# Patient Record
Sex: Female | Born: 1975 | Race: Black or African American | Hispanic: No | Marital: Single | Smoking: Current every day smoker
Health system: Southern US, Community
[De-identification: ages and names within clinical notes are randomized; demographics above are authoritative.]

## PROBLEM LIST (undated history)

## (undated) DIAGNOSIS — F419 Anxiety disorder, unspecified: Secondary | ICD-10-CM

---

## 1998-07-30 ENCOUNTER — Emergency Department (HOSPITAL_COMMUNITY): Admission: EM | Admit: 1998-07-30 | Discharge: 1998-07-31 | Payer: Self-pay | Admitting: Emergency Medicine

## 1998-12-31 ENCOUNTER — Emergency Department (HOSPITAL_COMMUNITY): Admission: EM | Admit: 1998-12-31 | Discharge: 1999-01-01 | Payer: Self-pay | Admitting: Internal Medicine

## 2000-04-08 ENCOUNTER — Encounter: Payer: Self-pay | Admitting: Obstetrics

## 2000-04-08 ENCOUNTER — Inpatient Hospital Stay (HOSPITAL_COMMUNITY): Admission: AD | Admit: 2000-04-08 | Discharge: 2000-04-08 | Payer: Self-pay | Admitting: Obstetrics

## 2000-04-10 ENCOUNTER — Inpatient Hospital Stay (HOSPITAL_COMMUNITY): Admission: AD | Admit: 2000-04-10 | Discharge: 2000-04-10 | Payer: Self-pay | Admitting: *Deleted

## 2003-07-27 ENCOUNTER — Emergency Department (HOSPITAL_COMMUNITY): Admission: EM | Admit: 2003-07-27 | Discharge: 2003-07-27 | Payer: Self-pay | Admitting: Emergency Medicine

## 2003-07-27 ENCOUNTER — Encounter: Payer: Self-pay | Admitting: Emergency Medicine

## 2005-07-29 ENCOUNTER — Other Ambulatory Visit: Admission: RE | Admit: 2005-07-29 | Discharge: 2005-07-29 | Payer: Self-pay | Admitting: Family Medicine

## 2007-07-12 ENCOUNTER — Inpatient Hospital Stay (HOSPITAL_COMMUNITY): Admission: AD | Admit: 2007-07-12 | Discharge: 2007-07-13 | Payer: Self-pay | Admitting: Obstetrics & Gynecology

## 2007-07-19 ENCOUNTER — Inpatient Hospital Stay (HOSPITAL_COMMUNITY): Admission: RE | Admit: 2007-07-19 | Discharge: 2007-07-19 | Payer: Self-pay

## 2007-10-06 ENCOUNTER — Ambulatory Visit (HOSPITAL_COMMUNITY): Admission: RE | Admit: 2007-10-06 | Discharge: 2007-10-06 | Payer: Self-pay | Admitting: Obstetrics & Gynecology

## 2007-10-14 ENCOUNTER — Ambulatory Visit: Payer: Self-pay | Admitting: Family Medicine

## 2007-11-11 ENCOUNTER — Ambulatory Visit: Payer: Self-pay | Admitting: Obstetrics & Gynecology

## 2007-11-12 ENCOUNTER — Ambulatory Visit: Payer: Self-pay | Admitting: Cardiology

## 2007-11-22 ENCOUNTER — Ambulatory Visit: Payer: Self-pay

## 2007-12-02 ENCOUNTER — Ambulatory Visit: Payer: Self-pay | Admitting: Obstetrics & Gynecology

## 2007-12-13 ENCOUNTER — Ambulatory Visit: Payer: Self-pay | Admitting: Obstetrics & Gynecology

## 2007-12-13 ENCOUNTER — Ambulatory Visit (HOSPITAL_COMMUNITY): Admission: RE | Admit: 2007-12-13 | Discharge: 2007-12-13 | Payer: Self-pay | Admitting: Obstetrics and Gynecology

## 2007-12-13 ENCOUNTER — Encounter: Payer: Self-pay | Admitting: Family Medicine

## 2007-12-13 LAB — CONVERTED CEMR LAB
Hemoglobin: 12 g/dL (ref 12.0–15.0)
MCHC: 32.2 g/dL (ref 30.0–36.0)
MCV: 86.9 fL (ref 78.0–100.0)
RBC: 4.29 M/uL (ref 3.87–5.11)

## 2007-12-27 ENCOUNTER — Ambulatory Visit: Payer: Self-pay | Admitting: Obstetrics & Gynecology

## 2008-01-10 ENCOUNTER — Ambulatory Visit: Payer: Self-pay | Admitting: *Deleted

## 2008-01-24 ENCOUNTER — Ambulatory Visit: Payer: Self-pay | Admitting: Obstetrics & Gynecology

## 2008-01-31 ENCOUNTER — Ambulatory Visit: Payer: Self-pay | Admitting: Obstetrics & Gynecology

## 2008-02-17 ENCOUNTER — Ambulatory Visit: Payer: Self-pay | Admitting: Obstetrics & Gynecology

## 2008-02-24 ENCOUNTER — Ambulatory Visit: Payer: Self-pay | Admitting: Obstetrics & Gynecology

## 2008-03-02 ENCOUNTER — Ambulatory Visit: Payer: Self-pay | Admitting: Advanced Practice Midwife

## 2008-03-02 ENCOUNTER — Inpatient Hospital Stay (HOSPITAL_COMMUNITY): Admission: AD | Admit: 2008-03-02 | Discharge: 2008-03-02 | Payer: Self-pay | Admitting: Obstetrics & Gynecology

## 2008-03-02 ENCOUNTER — Ambulatory Visit: Payer: Self-pay | Admitting: Obstetrics & Gynecology

## 2008-03-06 ENCOUNTER — Ambulatory Visit: Payer: Self-pay | Admitting: Obstetrics and Gynecology

## 2008-03-06 ENCOUNTER — Inpatient Hospital Stay (HOSPITAL_COMMUNITY): Admission: AD | Admit: 2008-03-06 | Discharge: 2008-03-08 | Payer: Self-pay | Admitting: Obstetrics & Gynecology

## 2008-03-06 ENCOUNTER — Inpatient Hospital Stay (HOSPITAL_COMMUNITY): Admission: AD | Admit: 2008-03-06 | Discharge: 2008-03-06 | Payer: Self-pay | Admitting: Gynecology

## 2008-03-09 ENCOUNTER — Ambulatory Visit: Payer: Self-pay | Admitting: Advanced Practice Midwife

## 2008-03-09 ENCOUNTER — Inpatient Hospital Stay (HOSPITAL_COMMUNITY): Admission: AD | Admit: 2008-03-09 | Discharge: 2008-03-09 | Payer: Self-pay | Admitting: Gynecology

## 2010-09-29 ENCOUNTER — Emergency Department (HOSPITAL_COMMUNITY): Admission: EM | Admit: 2010-09-29 | Discharge: 2010-09-29 | Payer: Self-pay | Admitting: Emergency Medicine

## 2011-02-16 ENCOUNTER — Inpatient Hospital Stay (HOSPITAL_COMMUNITY)
Admission: AD | Admit: 2011-02-16 | Discharge: 2011-02-16 | Disposition: A | Discharge status: Home / Self Care | Payer: Self-pay | Source: Ambulatory Visit | Attending: Obstetrics and Gynecology | Admitting: Obstetrics and Gynecology

## 2011-02-16 DIAGNOSIS — F411 Generalized anxiety disorder: Secondary | ICD-10-CM | POA: Insufficient documentation

## 2011-02-21 ENCOUNTER — Other Ambulatory Visit: Payer: Self-pay | Admitting: Family Medicine

## 2011-02-21 ENCOUNTER — Other Ambulatory Visit (HOSPITAL_COMMUNITY)
Admission: RE | Admit: 2011-02-21 | Discharge: 2011-02-21 | Disposition: A | Discharge status: Home / Self Care | Payer: Self-pay | Source: Ambulatory Visit | Attending: Family Medicine | Admitting: Family Medicine

## 2011-02-21 DIAGNOSIS — Z0000 Encounter for general adult medical examination without abnormal findings: Secondary | ICD-10-CM | POA: Insufficient documentation

## 2011-02-24 ENCOUNTER — Emergency Department (HOSPITAL_COMMUNITY)
Admission: EM | Admit: 2011-02-24 | Discharge: 2011-02-24 | Disposition: A | Discharge status: Home / Self Care | Payer: Self-pay | Attending: Emergency Medicine | Admitting: Emergency Medicine

## 2011-02-24 DIAGNOSIS — F410 Panic disorder [episodic paroxysmal anxiety] without agoraphobia: Secondary | ICD-10-CM | POA: Insufficient documentation

## 2011-02-24 DIAGNOSIS — R0602 Shortness of breath: Secondary | ICD-10-CM | POA: Insufficient documentation

## 2011-02-24 DIAGNOSIS — F411 Generalized anxiety disorder: Secondary | ICD-10-CM | POA: Insufficient documentation

## 2011-02-28 ENCOUNTER — Emergency Department (HOSPITAL_COMMUNITY): Payer: Self-pay

## 2011-02-28 ENCOUNTER — Emergency Department (HOSPITAL_COMMUNITY)
Admission: EM | Admit: 2011-02-28 | Discharge: 2011-02-28 | Disposition: A | Discharge status: Home / Self Care | Payer: Self-pay | Attending: Emergency Medicine | Admitting: Emergency Medicine

## 2011-02-28 DIAGNOSIS — R079 Chest pain, unspecified: Secondary | ICD-10-CM | POA: Insufficient documentation

## 2011-02-28 DIAGNOSIS — R0602 Shortness of breath: Secondary | ICD-10-CM | POA: Insufficient documentation

## 2011-02-28 LAB — POCT CARDIAC MARKERS
CKMB, poc: 7.6 ng/mL (ref 1.0–8.0)
Myoglobin, poc: 45.5 ng/mL (ref 12–200)
Troponin i, poc: 0.55 ng/mL (ref 0.00–0.09)

## 2011-02-28 LAB — CK TOTAL AND CKMB (NOT AT ARMC)
CK, MB: 0.6 ng/mL (ref 0.3–4.0)
Relative Index: INVALID (ref 0.0–2.5)
Total CK: 67 U/L (ref 7–177)

## 2011-02-28 LAB — PREGNANCY, URINE: Preg Test, Ur: NEGATIVE

## 2011-02-28 LAB — TROPONIN I
Troponin I: 0.01 ng/mL (ref 0.00–0.06)
Troponin I: 0.01 ng/mL (ref 0.00–0.06)

## 2011-02-28 LAB — URINALYSIS, ROUTINE W REFLEX MICROSCOPIC
Bilirubin Urine: NEGATIVE
Ketones, ur: NEGATIVE mg/dL
Nitrite: NEGATIVE
Protein, ur: NEGATIVE mg/dL
Specific Gravity, Urine: 1.007 (ref 1.005–1.030)
Urobilinogen, UA: 0.2 mg/dL (ref 0.0–1.0)

## 2011-02-28 LAB — POCT I-STAT, CHEM 8
BUN: 4 mg/dL — ABNORMAL LOW (ref 6–23)
Calcium, Ion: 1.08 mmol/L — ABNORMAL LOW (ref 1.12–1.32)
Chloride: 107 mEq/L (ref 96–112)
Potassium: 3.1 mEq/L — ABNORMAL LOW (ref 3.5–5.1)

## 2011-02-28 MED ORDER — IOHEXOL 300 MG/ML  SOLN
80.0000 mL | Freq: Once | INTRAMUSCULAR | Status: AC | PRN
  Administered 2011-02-28: 80 mL via INTRAVENOUS

## 2011-04-15 ENCOUNTER — Emergency Department (HOSPITAL_COMMUNITY)
Admission: EM | Admit: 2011-04-15 | Discharge: 2011-04-15 | Disposition: A | Discharge status: Home / Self Care | Payer: Self-pay | Attending: Emergency Medicine | Admitting: Emergency Medicine

## 2011-04-15 ENCOUNTER — Emergency Department (HOSPITAL_COMMUNITY): Payer: Self-pay

## 2011-04-15 DIAGNOSIS — F410 Panic disorder [episodic paroxysmal anxiety] without agoraphobia: Secondary | ICD-10-CM | POA: Insufficient documentation

## 2011-04-15 DIAGNOSIS — R0602 Shortness of breath: Secondary | ICD-10-CM | POA: Insufficient documentation

## 2011-04-15 LAB — BASIC METABOLIC PANEL
BUN: 11 mg/dL (ref 6–23)
CO2: 21 mEq/L (ref 19–32)
Chloride: 110 mEq/L (ref 96–112)
Creatinine, Ser: 0.57 mg/dL (ref 0.4–1.2)
GFR calc Af Amer: >60 mL/min (ref >60)
Potassium: 3.7 mEq/L (ref 3.5–5.1)

## 2011-04-15 LAB — CBC
Hemoglobin: 12.7 g/dL (ref 12.0–15.0)
MCH: 28.7 pg (ref 26.0–34.0)
MCV: 84 fL (ref 78.0–100.0)
Platelets: 247 10*3/uL (ref 150–400)
RBC: 4.43 MIL/uL (ref 3.87–5.11)
WBC: 8.6 10*3/uL (ref 4.0–10.5)

## 2011-04-15 LAB — DIFFERENTIAL
Lymphs Abs: 3.7 10*3/uL (ref 0.7–4.0)
Monocytes Relative: 6 % (ref 3–12)
Neutro Abs: 4.1 10*3/uL (ref 1.7–7.7)
Neutrophils Relative %: 47 % (ref 43–77)

## 2011-04-22 ENCOUNTER — Emergency Department (HOSPITAL_COMMUNITY)
Admission: EM | Admit: 2011-04-22 | Discharge: 2011-04-22 | Disposition: A | Discharge status: Home / Self Care | Payer: Self-pay | Attending: Emergency Medicine | Admitting: Emergency Medicine

## 2011-04-22 ENCOUNTER — Inpatient Hospital Stay (HOSPITAL_COMMUNITY)
Admission: AD | Admit: 2011-04-22 | Discharge: 2011-04-22 | Disposition: A | Discharge status: Home / Self Care | Payer: Self-pay | Source: Ambulatory Visit | Attending: Obstetrics & Gynecology | Admitting: Obstetrics & Gynecology

## 2011-04-22 ENCOUNTER — Inpatient Hospital Stay (HOSPITAL_COMMUNITY): Payer: Self-pay

## 2011-04-22 DIAGNOSIS — N390 Urinary tract infection, site not specified: Secondary | ICD-10-CM

## 2011-04-22 DIAGNOSIS — N949 Unspecified condition associated with female genital organs and menstrual cycle: Secondary | ICD-10-CM | POA: Insufficient documentation

## 2011-04-22 LAB — URINALYSIS, ROUTINE W REFLEX MICROSCOPIC
Glucose, UA: NEGATIVE mg/dL
Ketones, ur: 15 mg/dL — AB
Nitrite: NEGATIVE
Specific Gravity, Urine: >1.03 — ABNORMAL HIGH (ref 1.005–1.030)
pH: 6.5 (ref 5.0–8.0)

## 2011-04-22 LAB — WET PREP, GENITAL: Trich, Wet Prep: NONE SEEN

## 2011-04-22 LAB — GC/CHLAMYDIA PROBE AMP, GENITAL
Chlamydia, DNA Probe: NEGATIVE
GC Probe Amp, Genital: NEGATIVE

## 2011-04-22 LAB — POCT PREGNANCY, URINE: Preg Test, Ur: NEGATIVE

## 2011-04-22 LAB — URINE MICROSCOPIC-ADD ON

## 2011-04-23 LAB — URINE CULTURE
Colony Count: >=100000
Culture  Setup Time: 201205011025

## 2011-05-06 NOTE — Assessment & Plan Note (Signed)
[facility]                            CARDIOLOGY OFFICE NOTE   Leah [name], Leah [name]                      MRN:          [id number]  DATE:11/12/2007                            DOB:          [birth year]    I was asked by Dr. Elsie Lincoln of [facility] to evaluate  Leah. [name] about dizziness.   She is 35 years of age, African-American female, single, with 2 previous  children with vaginal deliveries.  She had no problems with those  pregnancies that she is aware of, or the deliveries.  She has noted  several episodes of dizziness.  Once was while she was just standing up  talking to friends.  She felt lightheaded but not true vertigo.  She did  not faint.  She felt no tachy palpitations or chest pain.  She has no  nausea or vomiting.  The second episode happened while she was just  sitting down.  She had not changed positions.  She does not particularly  describe any orthostatic symptoms.   Her past medical history is negative for any cardiac or CNS disorders.   She is currently on prenatal vitamins.   She had no known drug allergies.   She has smoked.  She quit July 2008.   She drinks very little caffeinated beverages.  She does not use any  recreational products.  She does not drink any alcohol.   SURGERY:  None.   FAMILY HISTORY:  Really negative for premature heart disease.   SOCIAL HISTORY:  She is single.  She has 2 children.  She is an  Event organiser.   REVIEW OF SYSTEMS:  Show some history of allergies and hay fever and  some chronic anemia, otherwise negative.   PHYSICAL EXAMINATION:  Her blood pressure is 117/73, her pulse is 89 and  regular.  She is 5 feet 6 inches and weighs 178 pounds.  HEENT:  Normocephalic, atraumatic.  PERRLA.  Extraocular movements  intact.  Sclerae clear.  Facial symmetry is normal.  Carotid upstrokes are equal bilaterally without bruits, no JVD.  Thyroid  is not enlarged, trachea is  midline.  LUNGS:  Clear.  HEART:  Reveals a regular rate and rhythm.  PMI is poorly appreciated.  There is no gallop.  There is no rub.  ABDOMINAL EXAM:  Gravida.  EXTREMITIES:  Reveal no edema.  Pulses are intact.  NEURO EXAM:  Intact.  SKIN:  Unremarkable.   Electrocardiogram is normal except for nonspecific ST changes.   ASSESSMENT:  Episodic dizziness, probably not cardiogenic.   PLAN:  1. A 2D echocardiogram to rule out any structural heart disease.  2. Orthostatic precautions reviewed.  3. Stay well hydrated.     Thomas C. Daleen Squibb, MD, [facility]  Electronically Signed    TCW/MedQ  DD: 11/12/2007  DT: 11/12/2007  Job #: [id number]   cc:   [facility]

## 2011-05-26 ENCOUNTER — Encounter (INDEPENDENT_AMBULATORY_CARE_PROVIDER_SITE_OTHER): Payer: Self-pay | Admitting: Family Medicine

## 2011-05-26 DIAGNOSIS — N949 Unspecified condition associated with female genital organs and menstrual cycle: Secondary | ICD-10-CM

## 2011-05-27 NOTE — Group Therapy Note (Signed)
Leah [name], Leah [name] NO.:  [id number]  MEDICAL RECORD NO.:  [id number]           PATIENT TYPE:  A  LOCATION:  [facility]                   FACILITY:  [facility]  PHYSICIAN:  Lucina Mellow, DO   DATE OF BIRTH:  Jan 24, 1976  DATE OF SERVICE:                                 CLINIC NOTE  The patient presents for followup in the MAU, Apr 22, 2011, for complaint of abdominal and pelvic pain.  HISTORY OF PRESENT ILLNESS:  The patient is a 35 year old gravida 5, para 3-0-2-3 who presented to the MAU on Apr 22, 2011, early in the morning and was seen by midwife for abdominal and pelvic pain.  The patient stated that the pain felt like labor pain.  She denied any bleeding or abnormal vaginal discharge at that time.  The patient knew that she had previously been diagnosed with fibroids.  The patient uses an IUD for contraception.  States that since the placement of the IUD is approximately 2 years ago, it seems as if her pelvic pain has increased in frequency since then.  The pain is not constant.  It comes and goes, she cannot time it with cycling or with need to urinate or defecate. The patient does state, however, that she has irritable bowel-type symptoms and feels like, on occasion she does have to strain to have a bowel movement.  While at other time, she has diarrhea and a sudden urge to use the restroom.  The patient denies any urinary symptoms or feeling urinary-type symptoms when the pain occurs; however, she does give a history of frequent urinary tract infections.  The patient states that she tried to have intercourse a few days ago and it was somewhat painful for her that the pain only lasted a short period time.  She states that she has a very mild amount of pain today, nothing that she is too concerned about.  Denies any need for medications for pain today.  She denies any bleeding or vaginal discharge.  She states that she was to continue to have her IUD in  place.  PAST MEDICAL HISTORY:  Anxiety and depression.  SURGICAL HISTORY:  None.  SOCIAL HISTORY:  She denies use of alcohol, drugs, but does continue to smoke.  MEDICATIONS:  She currently is on include: 1. Celexa 20 mg daily. 2. Clonazepam 0.5 mg p.r.n. 3. Prenatal vitamins.  PHYSICAL EXAMINATION:  VITAL SIGNS:  Blood pressure 103/67, pulse of 81, temperature of 97.4, weight of 161.2 pounds, height is 64.75 inches tall.  GENERAL:  She is a pleasant African American female who looks her stated age at 56 years' old. HEART:  Regular rate and rhythm. LUNGS:  Clear to auscultation bilaterally. GENITALIA:  Externally, she has normal-appearing female genitalia. Internally, she has a small amount of very thin white discharge.  The cervix is easily identified.  There are Nabothian cyst noted to be at 7 and 2 o'clock without any bleeding.  The IUD strings are easily visualized.  On bimanual exam, the uterus is palpated to be about approximately 10-12 weeks in size and nontender.  The ovaries are each identified bilaterally and there  is no tenderness on ovarian and adnexa palpation.  The back of the bladder is palpated and tried to elicit a response.  The patient states well and was uncomfortable later as she felt like she need to urinate just to not reproduce the pain.  Posterior pressure is minimally applied it and this again was uncomfortable, but did not reproduce her pain.  ASSESSMENT:  Pelvic pain.  Unknown GYN origin at this time.  The patient's records are reviewed and shared with her that she does have adenomyosis and a uterine fibroid noted on her ultrasound from the MAU, while these may be the source of abnormal uterine bleeding and continuous pain.  The IUD in place would help control the bleeding and I recommend that she continue to have the IUD in place.  At this time, the patient wishes to continue with the IUD for her birth control.  The patient also on her ultrasound  did not show any ovarian cyst to explain her pain.  We discussed options of seeing a urologist to identify and treat her for possible interstitial cystitis and also to speak with her primary care physician for treatment for irritable bowel syndrome and constipation intermittently can also lead to explain pattern of lower abdominal/pelvic pain.  The patient voices understanding of all this. She states that the next time she has intercourse, if she were to experience pain, she should stop.  We discussed that position may be key and also for her to deeply identify and evaluate were the pain is coming from, is it because of her bladder, could she have interstitial cystitis or is it because she needs to evacuate her bowel.  The patient could not completely discuss all of these terms as her daughter was in the room and she was quite shy about talking about this in front of her daughter, that she voiced her understanding.  The patient today has no pain or identifiable reason or treatment needs identified, so the patient is discharged from the clinic with p.r.n. followup.  The patient voices understanding and agrees with this plan.          ______________________________ Lucina Mellow, DO    SH/MEDQ  D:  05/26/2011  T:  05/27/2011  Job:  [id number]

## 2011-06-26 ENCOUNTER — Emergency Department (HOSPITAL_COMMUNITY)
Admission: EM | Admit: 2011-06-26 | Discharge: 2011-06-26 | Disposition: A | Discharge status: Home / Self Care | Payer: Self-pay | Attending: Emergency Medicine | Admitting: Emergency Medicine

## 2011-06-26 DIAGNOSIS — J3489 Other specified disorders of nose and nasal sinuses: Secondary | ICD-10-CM | POA: Insufficient documentation

## 2011-06-26 DIAGNOSIS — R0602 Shortness of breath: Secondary | ICD-10-CM | POA: Insufficient documentation

## 2011-08-10 ENCOUNTER — Emergency Department (HOSPITAL_COMMUNITY): Payer: Self-pay

## 2011-08-10 ENCOUNTER — Emergency Department (HOSPITAL_COMMUNITY)
Admission: EM | Admit: 2011-08-10 | Discharge: 2011-08-10 | Disposition: A | Discharge status: Home / Self Care | Payer: Self-pay | Attending: Emergency Medicine | Admitting: Emergency Medicine

## 2011-08-10 DIAGNOSIS — R079 Chest pain, unspecified: Secondary | ICD-10-CM | POA: Insufficient documentation

## 2011-08-10 DIAGNOSIS — R42 Dizziness and giddiness: Secondary | ICD-10-CM | POA: Insufficient documentation

## 2011-08-10 DIAGNOSIS — R0789 Other chest pain: Secondary | ICD-10-CM | POA: Insufficient documentation

## 2011-08-10 DIAGNOSIS — R799 Abnormal finding of blood chemistry, unspecified: Secondary | ICD-10-CM | POA: Insufficient documentation

## 2011-08-10 DIAGNOSIS — R0602 Shortness of breath: Secondary | ICD-10-CM | POA: Insufficient documentation

## 2011-08-10 LAB — POCT I-STAT TROPONIN I: Troponin i, poc: 0.01 ng/mL (ref 0.00–0.08)

## 2011-08-10 LAB — URINALYSIS, ROUTINE W REFLEX MICROSCOPIC
Bilirubin Urine: NEGATIVE
Glucose, UA: NEGATIVE mg/dL
Hgb urine dipstick: NEGATIVE
Ketones, ur: NEGATIVE mg/dL
Protein, ur: NEGATIVE mg/dL
Urobilinogen, UA: 1 mg/dL (ref 0.0–1.0)

## 2011-08-10 LAB — POCT I-STAT, CHEM 8
BUN: 10 mg/dL (ref 6–23)
Calcium, Ion: 1.2 mmol/L (ref 1.12–1.32)
HCT: 40 % (ref 36.0–46.0)
Hemoglobin: 13.6 g/dL (ref 12.0–15.0)
Sodium: 140 mEq/L (ref 135–145)
TCO2: 23 mmol/L (ref 0–100)

## 2011-08-10 LAB — URINE MICROSCOPIC-ADD ON

## 2011-08-10 LAB — D-DIMER, QUANTITATIVE: D-Dimer, Quant: 0.54 ug/mL-FEU — ABNORMAL HIGH (ref 0.00–0.48)

## 2011-08-10 MED ORDER — IOHEXOL 300 MG/ML  SOLN
100.0000 mL | Freq: Once | INTRAMUSCULAR | Status: AC | PRN
  Administered 2011-08-10: 100 mL via INTRAVENOUS

## 2011-09-10 LAB — POCT URINALYSIS DIP (DEVICE)
Bilirubin Urine: NEGATIVE
Ketones, ur: NEGATIVE
Nitrite: NEGATIVE
Operator id: 194561
pH: 6

## 2011-09-11 LAB — POCT URINALYSIS DIP (DEVICE)
Bilirubin Urine: NEGATIVE
Ketones, ur: NEGATIVE
Nitrite: NEGATIVE
Operator id: 297281
Protein, ur: NEGATIVE
pH: 6.5

## 2011-09-12 LAB — POCT URINALYSIS DIP (DEVICE)
Bilirubin Urine: NEGATIVE
Nitrite: NEGATIVE
Nitrite: NEGATIVE
Nitrite: NEGATIVE
Operator id: 297281
Operator id: 297281
Operator id: 148111
Protein, ur: 30 — AB
Protein, ur: NEGATIVE
Protein, ur: NEGATIVE
Urobilinogen, UA: 0.2
pH: 6
pH: 6.5
pH: 6.5

## 2011-09-15 LAB — POCT URINALYSIS DIP (DEVICE)
Ketones, ur: NEGATIVE
Ketones, ur: NEGATIVE
Nitrite: NEGATIVE
Protein, ur: 30 — AB
Protein, ur: NEGATIVE
Urobilinogen, UA: 0.2
Urobilinogen, UA: 2 — ABNORMAL HIGH
pH: 6
pH: 6

## 2011-09-15 LAB — GLUCOSE, RANDOM: Glucose, Bld: 87

## 2011-09-15 LAB — CBC
HCT: 40.2
MCHC: 33.5
MCV: 85.2
Platelets: 179
Platelets: 196
RDW: 14.5
WBC: 14.1 — ABNORMAL HIGH

## 2011-09-26 LAB — POCT URINALYSIS DIP (DEVICE)
Bilirubin Urine: NEGATIVE
Nitrite: NEGATIVE
Protein, ur: NEGATIVE
pH: 7

## 2011-09-29 LAB — POCT URINALYSIS DIP (DEVICE)
Nitrite: NEGATIVE
Protein, ur: NEGATIVE
pH: 7

## 2011-09-30 LAB — POCT URINALYSIS DIP (DEVICE)
Nitrite: NEGATIVE
Protein, ur: NEGATIVE
pH: 7.5

## 2011-10-01 LAB — POCT URINALYSIS DIP (DEVICE)
Nitrite: NEGATIVE
Protein, ur: NEGATIVE
pH: 6.5

## 2011-10-06 LAB — URINALYSIS, ROUTINE W REFLEX MICROSCOPIC
Glucose, UA: NEGATIVE
Hgb urine dipstick: NEGATIVE
Protein, ur: NEGATIVE

## 2011-10-06 LAB — URINE MICROSCOPIC-ADD ON

## 2011-10-06 LAB — CBC
HCT: 38
MCV: 85.4
Platelets: 253
RBC: 4.45
WBC: 10.1

## 2011-10-06 LAB — WET PREP, GENITAL: Yeast Wet Prep HPF POC: NONE SEEN

## 2012-02-28 ENCOUNTER — Encounter (HOSPITAL_COMMUNITY): Payer: Self-pay | Admitting: *Deleted

## 2012-02-28 ENCOUNTER — Emergency Department (HOSPITAL_COMMUNITY): Payer: Self-pay

## 2012-02-28 ENCOUNTER — Emergency Department (HOSPITAL_COMMUNITY)
Admission: EM | Admit: 2012-02-28 | Discharge: 2012-02-28 | Disposition: A | Discharge status: Home / Self Care | Payer: Self-pay | Attending: Emergency Medicine | Admitting: Emergency Medicine

## 2012-02-28 ENCOUNTER — Other Ambulatory Visit: Payer: Self-pay

## 2012-02-28 DIAGNOSIS — J329 Chronic sinusitis, unspecified: Secondary | ICD-10-CM | POA: Insufficient documentation

## 2012-02-28 DIAGNOSIS — R072 Precordial pain: Secondary | ICD-10-CM | POA: Insufficient documentation

## 2012-02-28 DIAGNOSIS — J3489 Other specified disorders of nose and nasal sinuses: Secondary | ICD-10-CM | POA: Insufficient documentation

## 2012-02-28 DIAGNOSIS — F17200 Nicotine dependence, unspecified, uncomplicated: Secondary | ICD-10-CM | POA: Insufficient documentation

## 2012-02-28 DIAGNOSIS — R05 Cough: Secondary | ICD-10-CM | POA: Insufficient documentation

## 2012-02-28 DIAGNOSIS — R059 Cough, unspecified: Secondary | ICD-10-CM | POA: Insufficient documentation

## 2012-02-28 HISTORY — DX: Anxiety disorder, unspecified: F41.9

## 2012-02-28 MED ORDER — AZITHROMYCIN 250 MG PO TABS: 250.0000 mg | ORAL_TABLET | Freq: Every day | ORAL | Status: AC

## 2012-02-28 MED ORDER — NAPROXEN 500 MG PO TABS: 500.0000 mg | ORAL_TABLET | Freq: Two times a day (BID) | ORAL | Status: DC

## 2012-02-28 MED ORDER — KETOROLAC TROMETHAMINE 60 MG/2ML IM SOLN
60.0000 mg | Freq: Once | INTRAMUSCULAR | Status: AC
  Administered 2012-02-28: 60 mg via INTRAMUSCULAR
  Filled 2012-02-28: qty 2

## 2012-02-28 NOTE — ED Notes (Signed)
The pt is c/o lt upper chest5 pain for 30 minutes.  She woke up with this pain.  She is coughing constantly.  She has no previous history of cardiac.  Hx of anxiety

## 2012-02-28 NOTE — ED Notes (Signed)
Patient reports substernal chest pain rates it 5/10 denies n/v/d, with shortness of breath

## 2012-02-28 NOTE — Discharge Instructions (Signed)
Your caregiver has diagnosed you as having chest pain that is nonspecific for one problem. This means that after looking at you and examining you and ordering tests (such as blood work, chest x-rays and EKG), your caregiver does not believe that the problem is serious enough to need watching in the hospital. This judgment is often made after testing shows no acute heart attack and you are at low risk for sudden acute heart condition. Chest pain comes from many different causes.  Seek immediate medical attention if:   You have severe chest pain, especially if the pain is crushing or pressure-like and spreads to the arms, back, neck, or jaw, or if you have sweating, nausea, shortness of breath. This is an emergency. Don't wait to see if the pain will go away. Get medical help at once. Call 911 immediately. Do not drive herself to the hospital.   Your chest pain gets worse and does not go away with rest.   You have an attack of chest pain lasting longer than usual, despite rest and treatment with the medications your caregiver has prescribed   You awaken from sleep with chest pain or shortness of breath.   You feel faint or dizzy   You have chest pain not typical of your usual pain for which you originally saw your caregiver.  You must have a repeat evaluation within 24 hours for a recheck of your heart.  Please call your doctor this morning to schedule this appointment. If you do not have a family doctor, please see the list of doctors below.  RESOURCE GUIDE  Dental Problems  Patients with Medicaid: [facility]                     [facility] [address]                                           [address] Phone:  [phone]                                                  Phone:  [phone]  If unable to pay or uninsured, contact:  [facility] or [facility] to become qualified for the adult dental clinic.  Chronic Pain  Problems Contact [facility]  [phone] Patients need to be referred by their primary care doctor.  Insufficient Money for Medicine Contact [employer]:  call "211" or [facility] 271-5999.  No Primary Care Doctor Call [facility]  832-8000 Other agencies that provide inexpensive medical care    [facility]  [phone]    [facility]  [phone]    [facility]  [phone]    Women's Clinic  [phone]    [facility]  [phone]    [facility]  [phone]  Psychological Services  Health  [phone] [facility]  [phone] [facility]   800 853-5163 (emergency services 641-4993)  Substance Abuse Resources [facility]  336-882-2125 [facility] 336-784-9470 [facility] 336-285-9073 [facility] 336-845-3988 Residential & Outpatient Substance Abuse Program  800-659-3381  Abuse/Neglect [place] Child Abuse Hotline (336) 641-3795 [place] Child Abuse Hotline 800-378-5315 (After Hours)  Emergency Shelter [facility] (336) 271-5985  [facility] (336) 275-9566 [facility] (704) 372-4663  MRSA Hotline #:   832-7006    [place] [facility]  [facility] [place]     [employer]                          [facility] [address] [place]                       [address]      [address]  [place]                                                [place]                            [place] Phone:  [phone]                                   Phone:  [phone]                 Phone:  [phone]  [facility] Phone:  [phone]  [place] Child Abuse Hotline (336) 342-1394 (336) 342-3537 (After Hours)    

## 2012-02-28 NOTE — ED Provider Notes (Signed)
History     CSN: [id number]  Arrival date & time 02/28/12  0431   First MD Initiated Contact with Patient 02/28/12 [phone]      Chief Complaint  Patient presents with  . Chest Pain    (Consider location/radiation/quality/duration/timing/severity/associated sxs/prior treatment) HPI Comments: Pt is a pleasant 36 y/o female who has no sig PMH who presents with CP which is sharp, mid sternal and worse with breathing out through her mouth (not through her nose).  Sx are persistent, fluctuate with the respiratory cycle, not associated with swelling, travel, trauma, exogenous estrogen use, recent surgery or SOB.  She has a cough X 1 month and sinus pressure and drainage over the last month.  This has not seemed to get better.  She has had no meds pta.  She also deneis leg swelling, diarrhea, dysuria, fever, headache or other c/o.  She has no FHx of DVT / PE and has no hx of CAD in the family either.  Sx are moderate at this time.  Patient is a 36 y.o. female presenting with chest pain. The history is provided by the patient and a relative.  Chest Pain     Past Medical History  Diagnosis Date  . Anxiety     No past surgical history on file.  No family history on file.  History  Substance Use Topics  . Smoking status: Current Everyday Smoker  . Smokeless tobacco: Not on file  . Alcohol Use: No    OB History    Grav Para Term Preterm Abortions TAB SAB Ect Mult Living                  Review of Systems  Cardiovascular: Positive for chest pain.  All other systems reviewed and are negative.    Allergies  Review of patient's allergies indicates no known allergies.  Home Medications   Current Outpatient Rx  Name Route Sig Dispense Refill  . SERTRALINE HCL 50 MG PO TABS Oral Take 100 mg by mouth daily.    . AZITHROMYCIN 250 MG PO TABS Oral Take 1 tablet (250 mg total) by mouth daily. 500mg  PO day 1, then 250mg  PO days 205 6 tablet 0  . NAPROXEN 500 MG PO TABS Oral Take 1  tablet (500 mg total) by mouth 2 (two) times daily with a meal. 30 tablet 0    BP 116/68  Pulse 62  Temp(Src) 97.8 F (36.6 C) (Oral)  Resp 18  SpO2 100%  Physical Exam  Nursing note and vitals reviewed. Constitutional: She appears well-developed and well-nourished. No distress.  HENT:  Head: Normocephalic and atraumatic.  Mouth/Throat: Oropharynx is clear and moist. No oropharyngeal exudate.  Eyes: Conjunctivae and EOM are normal. Pupils are equal, round, and reactive to light. Right eye exhibits no discharge. Left eye exhibits no discharge. No scleral icterus.  Neck: Normal range of motion. Neck supple. No JVD present. No thyromegaly present.  Cardiovascular: Normal rate, regular rhythm, normal heart sounds and intact distal pulses.  Exam reveals no gallop and no friction rub.   No murmur heard. Pulmonary/Chest: Effort normal and breath sounds normal. No respiratory distress. She has no wheezes. She has no rales. She exhibits tenderness ( reproducible chest ttp in the parastenal areas bilaterally).  Abdominal: Soft. Bowel sounds are normal. She exhibits no distension and no mass. There is no tenderness.  Musculoskeletal: Normal range of motion. She exhibits no edema and no tenderness.  Lymphadenopathy:    She has no cervical adenopathy.  Neurological: She is alert. Coordination normal.  Skin: Skin is warm and dry. No rash noted. No erythema.  Psychiatric: She has a normal mood and affect. Her behavior is normal.    ED Course  Procedures (including critical care time)  ED ECG REPORT   Date: 02/28/2012   Rate: 68  Rhythm: normal sinus rhythm  QRS Axis: normal  Intervals: normal  ST/T Wave abnormalities: normal  Conduction Disutrbances:none  Narrative Interpretation:   Old EKG Reviewed: c/w 08/10/2011, no sig change   Labs Reviewed - No data to display Dg Chest 2 View  02/28/2012  *RADIOLOGY REPORT*  Clinical Data: Shortness of breath, cough.  CHEST - 2 VIEW  Comparison:  08/10/2011 CT  Findings: No significant change from priors.  No focal consolidation.  No pleural effusion or pneumothorax.  Heart size within normal limits as are the other mediastinal contours.  No acute osseous abnormality.  IMPRESSION: No acute process identified.  Original Report Authenticated By: Waneta Martins, M.D.     1. Chest pain   2. Sinusitis       MDM  Lung sounds / heart sounds normal, ECG normal, Pt is negative in very low risk category for Wells Score, Simplified Revised Geneva Score and the PERC.  With cough for a month and reproducible chest ttp, unlikely to be PE, r/o ptx / pna, tx for sinusitis.  IM toradol ordered.  Review of the MR shows that pt had CP last year in August and was found to have normal PE protocol CT at that time.  I have personally reviewed the imaging - neg for acute findings - agree with radiologist.  Toradol given, home on Z pak for isnusitis and naprosyn for CP.  Return precautions given.  Vida Roller, MD 02/28/12 [phone]

## 2012-03-25 ENCOUNTER — Encounter (HOSPITAL_COMMUNITY): Payer: Self-pay | Admitting: Emergency Medicine

## 2012-03-25 ENCOUNTER — Emergency Department (HOSPITAL_COMMUNITY)
Admission: EM | Admit: 2012-03-25 | Discharge: 2012-03-25 | Disposition: A | Discharge status: Home / Self Care | Payer: Self-pay | Attending: Emergency Medicine | Admitting: Emergency Medicine

## 2012-03-25 ENCOUNTER — Emergency Department (HOSPITAL_COMMUNITY): Payer: Self-pay

## 2012-03-25 ENCOUNTER — Other Ambulatory Visit: Payer: Self-pay

## 2012-03-25 DIAGNOSIS — R072 Precordial pain: Secondary | ICD-10-CM | POA: Insufficient documentation

## 2012-03-25 DIAGNOSIS — F17200 Nicotine dependence, unspecified, uncomplicated: Secondary | ICD-10-CM | POA: Insufficient documentation

## 2012-03-25 DIAGNOSIS — R0602 Shortness of breath: Secondary | ICD-10-CM | POA: Insufficient documentation

## 2012-03-25 LAB — CBC
HCT: 40.6 % (ref 36.0–46.0)
Hemoglobin: 13.6 g/dL (ref 12.0–15.0)
MCH: 28.8 pg (ref 26.0–34.0)
MCHC: 33.5 g/dL (ref 30.0–36.0)
MCV: 86 fL (ref 78.0–100.0)
Platelets: 249 10*3/uL (ref 150–400)
RBC: 4.72 MIL/uL (ref 3.87–5.11)
RDW: 13.3 % (ref 11.5–15.5)
WBC: 8.6 10*3/uL (ref 4.0–10.5)

## 2012-03-25 LAB — DIFFERENTIAL
Basophils Absolute: 0.1 10*3/uL (ref 0.0–0.1)
Basophils Relative: 1 % (ref 0–1)
Eosinophils Absolute: 0.4 10*3/uL (ref 0.0–0.7)
Eosinophils Relative: 5 % (ref 0–5)
Lymphocytes Relative: 46 % (ref 12–46)
Lymphs Abs: 4 10*3/uL (ref 0.7–4.0)
Monocytes Absolute: 0.4 10*3/uL (ref 0.1–1.0)
Monocytes Relative: 5 % (ref 3–12)
Neutro Abs: 3.8 10*3/uL (ref 1.7–7.7)
Neutrophils Relative %: 44 % (ref 43–77)

## 2012-03-25 LAB — BASIC METABOLIC PANEL
BUN: 9 mg/dL (ref 6–23)
CO2: 20 mEq/L (ref 19–32)
Calcium: 8.8 mg/dL (ref 8.4–10.5)
Chloride: 107 mEq/L (ref 96–112)
Creatinine, Ser: 0.61 mg/dL (ref 0.50–1.10)
GFR calc Af Amer: >90 mL/min (ref >90)
GFR calc non Af Amer: >90 mL/min (ref >90)
Glucose, Bld: 102 mg/dL — ABNORMAL HIGH (ref 70–99)
Potassium: 4.1 mEq/L (ref 3.5–5.1)
Sodium: 139 mEq/L (ref 135–145)

## 2012-03-25 NOTE — ED Notes (Signed)
PT. REPORTS GENERALIZED CHEST PAIN " UNCOMFORTABLE" WITH SOB ONSET THIS EVENING , DENIES NAUSEA OR DIAPHORESIS.

## 2012-03-25 NOTE — ED Provider Notes (Signed)
History    24 six-year-old female with chest pain. Gradual onset about a day ago. The substernal area without radiation. Patient cannot remember to stay at onset. Was associated with mild shortness of breath. Pain actually resolved prior to my evaluation. No palpitations. No nausea, vomiting. No diaphoresis. No unusual leg pain or swelling. Denies personal or family history of blood clot.  CSN: [id number]  Arrival date & time 03/25/12  4098   First MD Initiated Contact with Patient 03/25/12 0300      Chief Complaint  Patient presents with  . Chest Pain    (Consider location/radiation/quality/duration/timing/severity/associated sxs/prior treatment) HPI  Past Medical History  Diagnosis Date  . Anxiety     History reviewed. No pertinent past surgical history.  No family history on file.  History  Substance Use Topics  . Smoking status: Current Everyday Smoker  . Smokeless tobacco: Not on file  . Alcohol Use: No    OB History    Grav Para Term Preterm Abortions TAB SAB Ect Mult Living                  Review of Systems   Review of symptoms negative unless otherwise noted in HPI.   Allergies  Review of patient's allergies indicates no known allergies.  Home Medications  No current outpatient prescriptions on file.  BP 96/58  Pulse 78  Temp(Src) 98.4 F (36.9 C) (Oral)  Resp 16  SpO2 100%  LMP 01/26/2012  Physical Exam  Nursing note and vitals reviewed. Constitutional: She appears well-developed and well-nourished. No distress.  HENT:  Head: Normocephalic and atraumatic.  Eyes: Conjunctivae are normal. Right eye exhibits no discharge. Left eye exhibits no discharge.  Neck: Neck supple.  Cardiovascular: Normal rate, regular rhythm and normal heart sounds.  Exam reveals no gallop and no friction rub.   No murmur heard. Pulmonary/Chest: Effort normal and breath sounds normal. No respiratory distress.       Upper anterior chest wall to inspection. Chest pain  is not reproducible.  Abdominal: Soft. She exhibits no distension. There is no tenderness.  Musculoskeletal: She exhibits no edema and no tenderness.  Neurological: She is alert.  Skin: Skin is warm and dry.  Psychiatric: She has a normal mood and affect. Her behavior is normal. Thought content normal.    ED Course  Procedures (including critical care time)  Labs Reviewed  BASIC METABOLIC PANEL - Abnormal; Notable for the following:    Glucose, Bld 102 (*)    All other components within normal limits  CBC  DIFFERENTIAL  POCT I-STAT TROPONIN I  LAB REPORT - SCANNED   Dg Chest 2 View  03/25/2012  *RADIOLOGY REPORT*  Clinical Data: Chest pain.  CHEST - 2 VIEW  Comparison: 03/10/2007.  Findings: The cardiac silhouette, mediastinal and hilar contours are within normal limits and stable.  The lungs are clear. Mild bronchitic type changes, likely related to smoking.  No pleural effusion.  The bony thorax is intact.  IMPRESSION: No acute pulmonary findings.  Original Report Authenticated By: P. Loralie Champagne, M.D.    EKG:  Rhythm: Normal sinus rhythm Rate: 74 Axis: Normal axis Intervals: Normal ST segments: Nonspecific ST changes. There is T-wave flattening anteriorly.  1. Chest pain       MDM  [age]-year-old female with chest pain. Results of my evaluation. Atypical in nature for ACS given constant duration for almost a day. EKG is non-provocative. Troponin is normal. Doubt infectious. Doubt PE. Other etiology is unclear at  this time, regular suspicion for emergent etiology. Return precautions were discussed. Outpatient followup.        Raeford Razor, MD 03/30/12 [phone]

## 2012-03-25 NOTE — ED Notes (Signed)
Pt reports having mid sternal chest pain since yesterday evening, constant and non radiating pain with mild shortness of breath. Pt denies any chest pain, shortness of breath or n/v/d at this time. Pt denies taking any medications, "it went away on its on".

## 2012-03-25 NOTE — Discharge Instructions (Signed)
Chest Pain (Nonspecific) It is often hard to give a specific diagnosis for the cause of chest pain. There is always a chance that your pain could be related to something serious, such as a heart attack or a blood clot in the lungs. You need to follow up with your caregiver for further evaluation. CAUSES   Heartburn.   Pneumonia or bronchitis.   Anxiety or stress.   Inflammation around your heart (pericarditis) or lung (pleuritis or pleurisy).   A blood clot in the lung.   A collapsed lung (pneumothorax). It can develop suddenly on its own (spontaneous pneumothorax) or from injury (trauma) to the chest.   Shingles infection (herpes zoster virus).  The chest wall is composed of bones, muscles, and cartilage. Any of these can be the source of the pain.  The bones can be bruised by injury.   The muscles or cartilage can be strained by coughing or overwork.   The cartilage can be affected by inflammation and become sore (costochondritis).  DIAGNOSIS  Lab tests or other studies, such as X-rays, electrocardiography, stress testing, or cardiac imaging, may be needed to find the cause of your pain.  TREATMENT   Treatment depends on what may be causing your chest pain. Treatment may include:   Acid blockers for heartburn.   Anti-inflammatory medicine.   Pain medicine for inflammatory conditions.   Antibiotics if an infection is present.   You may be advised to change lifestyle habits. This includes stopping smoking and avoiding alcohol, caffeine, and chocolate.   You may be advised to keep your head raised (elevated) when sleeping. This reduces the chance of acid going backward from your stomach into your esophagus.   Most of the time, nonspecific chest pain will improve within 2 to 3 days with rest and mild pain medicine.  HOME CARE INSTRUCTIONS   If antibiotics were prescribed, take your antibiotics as directed. Finish them even if you start to feel better.   For the next few  days, avoid physical activities that bring on chest pain. Continue physical activities as directed.   Do not smoke.   Avoid drinking alcohol.   Only take over-the-counter or prescription medicine for pain, discomfort, or fever as directed by your caregiver.   Follow your caregiver's suggestions for further testing if your chest pain does not go away.   Keep any follow-up appointments you made. If you do not go to an appointment, you could develop lasting (chronic) problems with pain. If there is any problem keeping an appointment, you must call to reschedule.  SEEK MEDICAL CARE IF:   You think you are having problems from the medicine you are taking. Read your medicine instructions carefully.   Your chest pain does not go away, even after treatment.   You develop a rash with blisters on your chest.  SEEK IMMEDIATE MEDICAL CARE IF:   You have increased chest pain or pain that spreads to your arm, neck, jaw, back, or abdomen.   You develop shortness of breath, an increasing cough, or you are coughing up blood.   You have severe back or abdominal pain, feel nauseous, or vomit.   You develop severe weakness, fainting, or chills.   You have a fever.  THIS IS AN EMERGENCY. Do not wait to see if the pain will go away. Get medical help at once. Call your local emergency services (911 in [place] Do not drive yourself to the hospital. MAKE SURE YOU:   Understand these instructions.     Will watch your condition.   Will get help right away if you are not doing well or get worse.  Document Released: 09/17/2005 Document Revised: 11/27/2011 Document Reviewed: 07/13/2008 [employer] Patient Information 2012 [employer]. 

## 2012-03-25 NOTE — ED Notes (Signed)
Pt denies any questions or pain upon discharge. 

## 2012-04-11 ENCOUNTER — Emergency Department (HOSPITAL_COMMUNITY)
Admission: EM | Admit: 2012-04-11 | Discharge: 2012-04-11 | Disposition: A | Discharge status: Home / Self Care | Payer: Self-pay | Attending: Emergency Medicine | Admitting: Emergency Medicine

## 2012-04-11 ENCOUNTER — Emergency Department (HOSPITAL_COMMUNITY): Payer: Self-pay

## 2012-04-11 ENCOUNTER — Encounter (HOSPITAL_COMMUNITY): Payer: Self-pay | Admitting: Emergency Medicine

## 2012-04-11 DIAGNOSIS — F411 Generalized anxiety disorder: Secondary | ICD-10-CM | POA: Insufficient documentation

## 2012-04-11 DIAGNOSIS — F419 Anxiety disorder, unspecified: Secondary | ICD-10-CM

## 2012-04-11 DIAGNOSIS — R0789 Other chest pain: Secondary | ICD-10-CM | POA: Insufficient documentation

## 2012-04-11 DIAGNOSIS — R0602 Shortness of breath: Secondary | ICD-10-CM | POA: Insufficient documentation

## 2012-04-11 DIAGNOSIS — R5381 Other malaise: Secondary | ICD-10-CM | POA: Insufficient documentation

## 2012-04-11 DIAGNOSIS — R5383 Other fatigue: Secondary | ICD-10-CM | POA: Insufficient documentation

## 2012-04-11 LAB — CBC
HCT: 42.5 % (ref 36.0–46.0)
Hemoglobin: 14.4 g/dL (ref 12.0–15.0)
MCH: 28.8 pg (ref 26.0–34.0)
MCV: 85 fL (ref 78.0–100.0)
RBC: 5 MIL/uL (ref 3.87–5.11)
WBC: 9.6 10*3/uL (ref 4.0–10.5)

## 2012-04-11 LAB — BASIC METABOLIC PANEL
BUN: 10 mg/dL (ref 6–23)
CO2: 27 mEq/L (ref 19–32)
Calcium: 9.5 mg/dL (ref 8.4–10.5)
Creatinine, Ser: 0.62 mg/dL (ref 0.50–1.10)
Glucose, Bld: 69 mg/dL — ABNORMAL LOW (ref 70–99)
Sodium: 141 mEq/L (ref 135–145)

## 2012-04-11 LAB — DIFFERENTIAL
Eosinophils Relative: 5 % (ref 0–5)
Lymphocytes Relative: 31 % (ref 12–46)
Lymphs Abs: 3 10*3/uL (ref 0.7–4.0)
Monocytes Absolute: 0.8 10*3/uL (ref 0.1–1.0)
Monocytes Relative: 8 % (ref 3–12)

## 2012-04-11 LAB — POCT I-STAT TROPONIN I

## 2012-04-11 NOTE — ED Notes (Signed)
Pt presented to the ER with c/o of ShoB and chest tightness, state sx started eariler today, pt also repots having discomfort in left rib cage area.

## 2012-04-11 NOTE — ED Provider Notes (Signed)
History     CSN: [id number]  Arrival date & time 04/11/12  2005   First MD Initiated Contact with Patient 04/11/12 2242      Chief Complaint  Patient presents with  . chest tightness   . Shortness of Breath    (Consider location/radiation/quality/duration/timing/severity/associated sxs/prior treatment) Patient is a 36 y.o. female presenting with shortness of breath. The history is provided by the patient.  Shortness of Breath  Associated symptoms include shortness of breath.   Patient here after becoming anxious while watching a mo and developing shortness of breath and chest tightness. History of anxiety and this is similar. Patient has been out of her anxiety medications which include Zoloft. Tightness was midsternal without diaphoresis or nausea. No recent fever or URI symptoms. No leg pain or swelling. Has been seen in the ED multiple times for same with negative workups. Patient now feels back to her baseline. Denies any suicidal or homicidal ideations.  Past Medical History  Diagnosis Date  . Anxiety     History reviewed. No pertinent past surgical history.  Family History  Problem Relation Age of Onset  . Cancer Mother   . Cancer Other     History  Substance Use Topics  . Smoking status: Current Everyday Smoker    Types: Cigarettes  . Smokeless tobacco: Not on file  . Alcohol Use: Yes     social    OB History    Grav Para Term Preterm Abortions TAB SAB Ect Mult Living                  Review of Systems  Respiratory: Positive for shortness of breath.   All other systems reviewed and are negative.    Allergies  Review of patient's allergies indicates no known allergies.  Home Medications  No current outpatient prescriptions on file.  BP 109/62  Pulse 72  Temp(Src) 98.3 F (36.8 C) (Oral)  Resp 20  SpO2 99%  LMP 02/23/2012  Physical Exam  Nursing note and vitals reviewed. Constitutional: She is oriented to person, place, and time. She appears  well-developed and well-nourished.  Non-toxic appearance. No distress.  HENT:  Head: Normocephalic and atraumatic.  Eyes: Conjunctivae, EOM and lids are normal. Pupils are equal, round, and reactive to light.  Neck: Normal range of motion. Neck supple. No tracheal deviation present. No mass present.  Cardiovascular: Normal rate, regular rhythm and normal heart sounds.  Exam reveals no gallop.   No murmur heard. Pulmonary/Chest: Effort normal and breath sounds normal. No stridor. No respiratory distress. She has no decreased breath sounds. She has no wheezes. She has no rhonchi. She has no rales.  Abdominal: Soft. Normal appearance and bowel sounds are normal. She exhibits no distension. There is no tenderness. There is no rebound and no CVA tenderness.  Musculoskeletal: Normal range of motion. She exhibits no edema and no tenderness.  Neurological: She is alert and oriented to person, place, and time. She has normal strength. No cranial nerve deficit or sensory deficit. GCS eye subscore is 4. GCS verbal subscore is 5. GCS motor subscore is 6.  Skin: Skin is warm and dry. No abrasion and no rash noted.  Psychiatric: She has a normal mood and affect. Her speech is normal and behavior is normal. She expresses no homicidal and no suicidal ideation.    ED Course  Procedures (including critical care time)  Labs Reviewed  BASIC METABOLIC PANEL - Abnormal; Notable for the following:    Glucose, Bld  69 (*)    All other components within normal limits  CBC  DIFFERENTIAL  POCT I-STAT TROPONIN I   Dg Chest 2 View  04/11/2012  *RADIOLOGY REPORT*  Clinical Data: Shortness of breath and weakness  CHEST - 2 VIEW  Comparison: 03/25/2012  Findings: Normal heart size and pulmonary vascularity.  No focal airspace consolidation in the lungs.  No blunting of costophrenic angles.  No pneumothorax.  No significant changes since the previous study.  IMPRESSION: No evidence of active pulmonary disease.  Original  Report Authenticated By: Marlon Pel, M.D.     No diagnosis found.    MDM   Date: 04/11/2012  Rate: 70  Rhythm: normal sinus rhythm  QRS Axis: normal  Intervals: normal  ST/T Wave abnormalities: normal  Conduction Disutrbances:none  Narrative Interpretation:   Old EKG Reviewed: unchanged  Patient symptoms consistent with anxiety. No concern for ACS or pulmonary embolism. Patient will followup with her therapist          Toy Baker, MD 04/11/12 2251

## 2012-04-11 NOTE — Discharge Instructions (Signed)

## 2012-04-11 NOTE — ED Notes (Signed)
Family states pt had tightness in her chest with shortness of breath that started about 45 mins ago

## 2012-04-11 NOTE — ED Notes (Signed)
EKG performed and given to [facility] along with previous EKG

## 2012-09-04 ENCOUNTER — Encounter (HOSPITAL_COMMUNITY): Payer: Self-pay | Admitting: Emergency Medicine

## 2012-09-04 ENCOUNTER — Emergency Department (HOSPITAL_COMMUNITY)
Admission: EM | Admit: 2012-09-04 | Discharge: 2012-09-04 | Disposition: A | Discharge status: Home / Self Care | Payer: Self-pay | Attending: Emergency Medicine | Admitting: Emergency Medicine

## 2012-09-04 DIAGNOSIS — F17200 Nicotine dependence, unspecified, uncomplicated: Secondary | ICD-10-CM | POA: Insufficient documentation

## 2012-09-04 DIAGNOSIS — Z809 Family history of malignant neoplasm, unspecified: Secondary | ICD-10-CM | POA: Insufficient documentation

## 2012-09-04 DIAGNOSIS — T148XXA Other injury of unspecified body region, initial encounter: Secondary | ICD-10-CM | POA: Insufficient documentation

## 2012-09-04 DIAGNOSIS — X58XXXA Exposure to other specified factors, initial encounter: Secondary | ICD-10-CM | POA: Insufficient documentation

## 2012-09-04 DIAGNOSIS — F411 Generalized anxiety disorder: Secondary | ICD-10-CM | POA: Insufficient documentation

## 2012-09-04 MED ORDER — IBUPROFEN 400 MG PO TABS
800.0000 mg | ORAL_TABLET | Freq: Once | ORAL | Status: AC
  Administered 2012-09-04: 800 mg via ORAL
  Filled 2012-09-04: qty 2

## 2012-09-04 MED ORDER — IBUPROFEN 800 MG PO TABS: 800.0000 mg | ORAL_TABLET | Freq: Three times a day (TID) | ORAL | Status: AC | PRN

## 2012-09-04 NOTE — ED Notes (Addendum)
Pt reports R arm pain, and tingling since yesterday--reports some radiation to neck; denies injury; pt reports laid in bed all day and got worse

## 2012-09-04 NOTE — ED Provider Notes (Signed)
Medical screening examination/treatment/procedure(s) were performed by non-physician practitioner and as supervising physician I was immediately available for consultation/collaboration.   Lyanne Co, MD 09/04/12 2240

## 2012-09-04 NOTE — ED Provider Notes (Signed)
History     CSN: [id number]  Arrival date & time 09/04/12  0020   First MD Initiated Contact with Patient 09/04/12 0200      Chief Complaint  Patient presents with  . Arm Pain   HPI  History provided by the patient and friends. Patient is a 36 year old female with no significant PMH who presents with points of right upper extremity pain and tingling. She reports waking up Friday morning with pain and tingling sensation in right upper arm. Pain begins around the neck area and travels down the shoulder and arm. She denies any weakness or paralysis of the arm. She denies any swelling. There was no injury. 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 She denies having similar symptoms previously from work. Patient has not taken any medications for symptoms were used any treatments. She has no other complaints or concerns.   Past Medical History  Diagnosis Date  . Anxiety     History reviewed. No pertinent past surgical history.  Family History  Problem Relation Age of Onset  . Cancer Mother   . Cancer Other     History  Substance Use Topics  . Smoking status: Current Every Day Smoker -- 1.0 packs/day    Types: Cigarettes  . Smokeless tobacco: Not on file  . Alcohol Use: Yes     social    OB History    Grav Para Term Preterm Abortions TAB SAB Ect Mult Living                  Review of Systems  Constitutional: Negative for fever and chills.  HENT: Positive for neck pain. Negative for neck stiffness.   Musculoskeletal: Negative for back pain.  Neurological: Negative for weakness.    Allergies  Review of patient's allergies indicates no known allergies.  Home Medications   Current Outpatient Rx  Name Route Sig Dispense Refill  . SERTRALINE HCL 100 MG PO TABS Oral Take 100 mg by mouth daily.      BP 103/69  Pulse 72  Temp 98.5 F (36.9 C) (Oral)  Resp 18  SpO2 97%  Physical Exam  Nursing note and vitals  reviewed. Constitutional: She is oriented to person, place, and time. She appears well-developed and well-nourished. No distress.  HENT:  Head: Normocephalic.  Neck: Normal range of motion. Neck supple.  Cardiovascular: Normal rate and regular rhythm.   No murmur heard. Pulmonary/Chest: Effort normal and breath sounds normal. No respiratory distress. She has no wheezes.  Musculoskeletal: Normal range of motion. She exhibits no edema and no tenderness.       Cervical back: She exhibits tenderness. She exhibits no bony tenderness.       Thoracic back: Normal.       Lumbar back: Normal.       Back:       No deformities of right upper extremity. No swelling. Will range of motion of all joints. Normal grip strength. Normal distal pulses, sensation in fingers and cap refill less than 2 seconds.  Neurological: She is alert and oriented to person, place, and time. She has normal strength. No sensory deficit.  Skin: Skin is warm and dry. No rash noted.  Psychiatric: She has a normal mood and affect. Her behavior is normal.    ED Course  Procedures     1. Muscle strain       MDM  2:30 AM patient seen and evaluated. Symptoms  seem consistent with overuse injuries, muscle strain or possible cervical radicular pains. I discussed with patient options for cervical x-ray but this pain patient's first occurrence of symptoms she has opted to have symptomatic treatment and continued outpatient followup with PCP and does not wish to have x-rays at this time. She has no history of trauma or concerning symptoms on exam.       Angus Seller, PA 09/04/12 [phone]

## 2013-04-13 ENCOUNTER — Encounter (HOSPITAL_COMMUNITY): Payer: Self-pay

## 2013-04-13 ENCOUNTER — Emergency Department (HOSPITAL_COMMUNITY)
Admission: EM | Admit: 2013-04-13 | Discharge: 2013-04-13 | Disposition: A | Discharge status: Home / Self Care | Payer: Self-pay | Attending: Emergency Medicine | Admitting: Emergency Medicine

## 2013-04-13 DIAGNOSIS — L299 Pruritus, unspecified: Secondary | ICD-10-CM | POA: Insufficient documentation

## 2013-04-13 DIAGNOSIS — T7840XA Allergy, unspecified, initial encounter: Secondary | ICD-10-CM

## 2013-04-13 DIAGNOSIS — R21 Rash and other nonspecific skin eruption: Secondary | ICD-10-CM | POA: Insufficient documentation

## 2013-04-13 DIAGNOSIS — Z8659 Personal history of other mental and behavioral disorders: Secondary | ICD-10-CM | POA: Insufficient documentation

## 2013-04-13 DIAGNOSIS — F17200 Nicotine dependence, unspecified, uncomplicated: Secondary | ICD-10-CM | POA: Insufficient documentation

## 2013-04-13 DIAGNOSIS — J3489 Other specified disorders of nose and nasal sinuses: Secondary | ICD-10-CM | POA: Insufficient documentation

## 2013-04-13 MED ORDER — DIPHENHYDRAMINE HCL 25 MG PO CAPS
25.0000 mg | ORAL_CAPSULE | Freq: Once | ORAL | Status: AC
  Administered 2013-04-13: 25 mg via ORAL
  Filled 2013-04-13: qty 1

## 2013-04-13 MED ORDER — FAMOTIDINE 20 MG PO TABS
20.0000 mg | ORAL_TABLET | Freq: Once | ORAL | Status: AC
  Administered 2013-04-13: 20 mg via ORAL
  Filled 2013-04-13: qty 1

## 2013-04-13 MED ORDER — PREDNISONE 20 MG PO TABS
60.0000 mg | ORAL_TABLET | Freq: Once | ORAL | Status: AC
  Administered 2013-04-13: 60 mg via ORAL
  Filled 2013-04-13: qty 3

## 2013-04-13 NOTE — ED Notes (Signed)
Pt states has been started on allegra on Saturday for allergies.  Pt now with sinus congestion.  States she has rash and tightness in throat.  Respiratory not compromised and patient able to speak without difficulty.  States she does have hx of anxiety and is wondering if her difficulty breathing could be related.

## 2013-04-13 NOTE — ED Provider Notes (Signed)
History     CSN: [id number]  Arrival date & time 04/13/13  2112   First MD Initiated Contact with Patient 04/13/13 2142      Chief Complaint  Patient presents with  . Nasal Congestion  . Rash    (Consider location/radiation/quality/duration/timing/severity/associated sxs/prior treatment) HPI Comments: Patient evaluated by primary care physician 4 days ago for seasonal allergies, nasal congestion, and rash. Patient was started on Allegra at that time and prescribed a Medrol Dosepak which she did not fill. Patient states that approximately 2 hours ago she began having a tight sensation in her throat. She denies shortness of breath or wheezing. Patient also states that her throat is "itching" since she began taking the Allegra. No nausea or vomiting. No syncope. Rash is unchanged. No fever. No cough. Onset of symptoms gradual. Course is constant. Nothing makes symptoms better or worse.  Patient is a 37 y.o. female presenting with rash. The history is provided by the patient.  Rash Associated symptoms: no fever, no myalgias, no nausea, no shortness of breath, not vomiting and not wheezing     Past Medical History  Diagnosis Date  . Anxiety     History reviewed. No pertinent past surgical history.  Family History  Problem Relation Age of Onset  . Cancer Mother   . Cancer Other     History  Substance Use Topics  . Smoking status: Current Every Day Smoker -- 1.00 packs/day    Types: Cigarettes  . Smokeless tobacco: Not on file  . Alcohol Use: Yes     Comment: social    OB History   Grav Para Term Preterm Abortions TAB SAB Ect Mult Living                  Review of Systems  Constitutional: Negative for fever.  HENT: Negative for facial swelling and trouble swallowing.   Eyes: Negative for redness.  Respiratory: Negative for shortness of breath, wheezing and stridor.   Cardiovascular: Negative for chest pain.  Gastrointestinal: Negative for nausea and vomiting.   Musculoskeletal: Negative for myalgias.  Skin: Positive for rash.  Neurological: Negative for light-headedness.  Psychiatric/Behavioral: Negative for confusion.    Allergies  Review of patient's allergies indicates no known allergies.  Home Medications   Current Outpatient Rx  Name  Route  Sig  Dispense  Refill  . fexofenadine (ALLEGRA) 180 MG tablet   Oral   Take 180 mg by mouth daily.         Marland Kitchen levonorgestrel (MIRENA) 20 MCG/24HR IUD   Intrauterine   1 each by Intrauterine route continuous.           BP 124/78  Pulse 76  Temp(Src) 97.7 F (36.5 C) (Oral)  Resp 16  SpO2 100%  LMP 03/30/2013  Physical Exam  Nursing note and vitals reviewed. Constitutional: She appears well-developed and well-nourished.  HENT:  Head: Normocephalic and atraumatic.  No angioedema. No uvula swelling. Patient speaking in full sentences without difficulty.  Eyes: Conjunctivae are normal. Right eye exhibits no discharge. Left eye exhibits no discharge.  Neck: Normal range of motion. Neck supple.  Cardiovascular: Normal rate, regular rhythm and normal heart sounds.   Pulmonary/Chest: Effort normal and breath sounds normal. No stridor.  Abdominal: Soft. There is no tenderness.  Neurological: She is alert.  Skin: Skin is warm and dry.  Psychiatric: She has a normal mood and affect.    ED Course  Procedures (including critical care time)  Labs Reviewed - No  data to display No results found.   1. Allergic reaction, initial encounter     9:52 PM Patient seen and examined. Medications ordered. Patient appears well. No airway compromise.   Vital signs reviewed and are as follows: Filed Vitals:   04/13/13 2116  BP: 124/78  Pulse: 76  Temp: 97.7 F (36.5 C)  Resp: 16   Patient reexamined. She is sleepy due to Benadryl but states that she is feeling better. No throat tightness. Patient will fill a previous prescription for Medrol Dosepak. Urged continued use of Benadryl and  Pepcid as needed. Urged patient to discontinue Allegra.   Patient counseled to call 911 with mouth swelling, tongue swelling, difficulty breathing. She verbalizes understanding and agrees with plan.  MDM  Patient with apparent mild allergic reaction. No signs of anaphylaxis. No respiratory compromise or angioedema. Patient responded well in emergency department to prednisone, Benadryl, Pepcid. Improved during emergency department stay. Patient followup as needed, fill her previous prescription.        Renne Crigler, PA-C 04/14/13 [phone]

## 2013-04-15 NOTE — ED Provider Notes (Signed)
  Medical screening examination/treatment/procedure(s) were performed by non-physician practitioner and as supervising physician I was immediately available for consultation/collaboration.    Ithiel Liebler, MD 04/15/13 1943 

## 2013-08-19 ENCOUNTER — Emergency Department (HOSPITAL_COMMUNITY)
Admission: EM | Admit: 2013-08-19 | Discharge: 2013-08-19 | Disposition: A | Discharge status: Home / Self Care | Payer: Medicaid Other | Attending: Emergency Medicine | Admitting: Emergency Medicine

## 2013-08-19 ENCOUNTER — Encounter (HOSPITAL_COMMUNITY): Payer: Self-pay | Admitting: Emergency Medicine

## 2013-08-19 DIAGNOSIS — F419 Anxiety disorder, unspecified: Secondary | ICD-10-CM

## 2013-08-19 DIAGNOSIS — F17200 Nicotine dependence, unspecified, uncomplicated: Secondary | ICD-10-CM | POA: Insufficient documentation

## 2013-08-19 DIAGNOSIS — R42 Dizziness and giddiness: Secondary | ICD-10-CM | POA: Insufficient documentation

## 2013-08-19 DIAGNOSIS — F411 Generalized anxiety disorder: Secondary | ICD-10-CM | POA: Insufficient documentation

## 2013-08-19 DIAGNOSIS — R259 Unspecified abnormal involuntary movements: Secondary | ICD-10-CM | POA: Insufficient documentation

## 2013-08-19 DIAGNOSIS — R21 Rash and other nonspecific skin eruption: Secondary | ICD-10-CM | POA: Insufficient documentation

## 2013-08-19 DIAGNOSIS — Z79899 Other long term (current) drug therapy: Secondary | ICD-10-CM | POA: Insufficient documentation

## 2013-08-19 MED ORDER — ALPRAZOLAM 0.25 MG PO TABS
0.2500 mg | ORAL_TABLET | Freq: Once | ORAL | Status: AC
  Administered 2013-08-19: 0.25 mg via ORAL
  Filled 2013-08-19: qty 1

## 2013-08-19 MED ORDER — ALPRAZOLAM 0.25 MG PO TABS: 0.2500 mg | ORAL_TABLET | Freq: Three times a day (TID) | ORAL | Status: DC | PRN

## 2013-08-19 NOTE — ED Provider Notes (Signed)
CSN: [id number]     Arrival date & time 08/19/13  0759 History   First MD Initiated Contact with Patient 08/19/13 [phone]     Chief Complaint  Patient presents with  . Shortness of Breath  . Dizziness   (Consider location/radiation/quality/duration/timing/severity/associated sxs/prior Treatment) HPI Patient was in her normal state of health last night when she took to [employer] alcoholic shots. She then began to feel short of breath, lightheaded, tremulous and nervous. States she felt that her tongue was swelling. EMS was called at the time that she was transported to the hospital. States her symptoms have somewhat improved but she still feels that she isn't completely "right". Denies chest pain, focal weakness, numbness, vision changes, shortness of breath, facial or throat swelling. She admits to being under increased stress as of late. Patient has had a rash to her right arm it has been itching for the past week. She has no new rashes with her symptoms that started last night. Past Medical History  Diagnosis Date  . Anxiety    History reviewed. No pertinent past surgical history. Family History  Problem Relation Age of Onset  . Cancer Mother   . Cancer Other    History  Substance Use Topics  . Smoking status: Current Every Day Smoker -- 1.00 packs/day    Types: Cigarettes  . Smokeless tobacco: Not on file  . Alcohol Use: Yes     Comment: social   OB History   Grav Para Term Preterm Abortions TAB SAB Ect Mult Living                 Review of Systems  Constitutional: Negative for fever and chills.  HENT: Negative for facial swelling, neck pain and neck stiffness.   Eyes: Negative for visual disturbance.  Respiratory: Positive for shortness of breath. Negative for cough and wheezing.   Cardiovascular: Negative for chest pain, palpitations and leg swelling.  Gastrointestinal: Negative for nausea, vomiting and abdominal pain.  Musculoskeletal: Negative for myalgias and back pain.   Skin: Positive for rash. Negative for pallor and wound.  Neurological: Positive for dizziness and light-headedness. Negative for seizures, syncope, weakness, numbness and headaches.  All other systems reviewed and are negative.    Allergies  Review of patient's allergies indicates no known allergies.  Home Medications   Current Outpatient Rx  Name  Route  Sig  Dispense  Refill  . hydrOXYzine (ATARAX/VISTARIL) 25 MG tablet   Oral   Take 12.5 mg by mouth 3 (three) times daily as needed for anxiety.         Marland Kitchen levonorgestrel (MIRENA) 20 MCG/24HR IUD   Intrauterine   1 each by Intrauterine route continuous.         . ALPRAZolam (XANAX) 0.25 MG tablet   Oral   Take 1 tablet (0.25 mg total) by mouth 3 (three) times daily as needed for anxiety.   10 tablet   0    BP 104/63  Pulse 64  Temp(Src) 97.9 F (36.6 C) (Oral)  Resp 19  SpO2 100% Physical Exam  Nursing note and vitals reviewed. Constitutional: She is oriented to person, place, and time. She appears well-developed and well-nourished. No distress.  HENT:  Head: Normocephalic and atraumatic.  Mouth/Throat: Oropharynx is clear and moist.  No lip, tongue, posterior pharyngeal swelling  Eyes: EOM are normal. Pupils are equal, round, and reactive to light.  Neck: Normal range of motion. Neck supple.  Cardiovascular: Normal rate and regular rhythm.  Pulmonary/Chest: Effort normal and breath sounds normal. No respiratory distress. She has no wheezes. She has no rales. She exhibits no tenderness.  Abdominal: Soft. Bowel sounds are normal. She exhibits no distension and no mass. There is no tenderness. There is no rebound and no guarding.  Musculoskeletal: Normal range of motion. She exhibits no edema and no tenderness.  No extremity swelling or edema  Neurological: She is alert and oriented to person, place, and time.  Patient is alert and oriented x3 with clear, goal oriented speech. Patient has 5/5 motor in all  extremities. Sensation is intact to light touch.    Skin: Skin is warm and dry. Rash (patient has diffuse papular rash to right upper extremity that is pruritic. No erythema) noted. No erythema.  Psychiatric:  Patient mildly anxious and at times tearful    ED Course  Procedures (including critical care time) Labs Review Labs Reviewed - No data to display Imaging Review No results found.  Date: 08/19/2013  Rate: 61  Rhythm: normal sinus rhythm  QRS Axis: normal  Intervals: normal  ST/T Wave abnormalities: normal  Conduction Disutrbances:none  Narrative Interpretation:   Old EKG Reviewed: When compared with prior EKGs no significant changes are seen   MDM   I believe the patient's symptoms are likely due to combination of high doses of caffeine with underlying anxiety disorder. EKG shows no new changes. I do not believe further laboratory workup is necessary in this patient. Treat symptomatically and reassessed in the emergency department. Patient has been advised to avoid high doses of caffeine. Patient says she feels much better after receiving Xanax. Vital signs remained stable in the emergency department. She has no shortness of breath or chest pain. Patient has been given thorough return precautions.  Loren Racer, MD 08/19/13 [phone]

## 2013-08-19 NOTE — Progress Notes (Signed)
[facility] did not get to see patient but will be sending her information about the [facility] Orange Card program, using the address provided.  °

## 2013-08-19 NOTE — ED Notes (Signed)
Pt states she had two shots of vodka last night with red bull

## 2013-08-19 NOTE — ED Notes (Signed)
Pt states last night pt was drinking a red bull and she began having sob and dizzyness. Called ems last night and was seen and cleared by them. THis am she still hot and not feeling right.

## 2013-10-10 ENCOUNTER — Emergency Department (HOSPITAL_COMMUNITY)
Admission: EM | Admit: 2013-10-10 | Discharge: 2013-10-10 | Disposition: A | Discharge status: Home / Self Care | Payer: Medicaid Other | Attending: Emergency Medicine | Admitting: Emergency Medicine

## 2013-10-10 ENCOUNTER — Encounter (HOSPITAL_COMMUNITY): Payer: Self-pay | Admitting: Emergency Medicine

## 2013-10-10 ENCOUNTER — Emergency Department (HOSPITAL_COMMUNITY): Payer: Medicaid Other

## 2013-10-10 DIAGNOSIS — R0789 Other chest pain: Secondary | ICD-10-CM

## 2013-10-10 DIAGNOSIS — R110 Nausea: Secondary | ICD-10-CM | POA: Insufficient documentation

## 2013-10-10 DIAGNOSIS — R071 Chest pain on breathing: Secondary | ICD-10-CM | POA: Insufficient documentation

## 2013-10-10 DIAGNOSIS — F411 Generalized anxiety disorder: Secondary | ICD-10-CM | POA: Insufficient documentation

## 2013-10-10 DIAGNOSIS — Z79899 Other long term (current) drug therapy: Secondary | ICD-10-CM | POA: Insufficient documentation

## 2013-10-10 DIAGNOSIS — F17200 Nicotine dependence, unspecified, uncomplicated: Secondary | ICD-10-CM | POA: Insufficient documentation

## 2013-10-10 DIAGNOSIS — R0602 Shortness of breath: Secondary | ICD-10-CM | POA: Insufficient documentation

## 2013-10-10 LAB — POCT I-STAT, CHEM 8
Chloride: 106 mEq/L (ref 96–112)
Glucose, Bld: 77 mg/dL (ref 70–99)
HCT: 40 % (ref 36.0–46.0)
Hemoglobin: 13.6 g/dL (ref 12.0–15.0)
Potassium: 3.5 mEq/L (ref 3.5–5.1)

## 2013-10-10 LAB — CBC WITH DIFFERENTIAL/PLATELET
Eosinophils Relative: 5 % (ref 0–5)
HCT: 38.1 % (ref 36.0–46.0)
Hemoglobin: 12.9 g/dL (ref 12.0–15.0)
Lymphocytes Relative: 47 % — ABNORMAL HIGH (ref 12–46)
Lymphs Abs: 3.1 10*3/uL (ref 0.7–4.0)
MCV: 85.2 fL (ref 78.0–100.0)
Monocytes Absolute: 0.4 10*3/uL (ref 0.1–1.0)
RBC: 4.47 MIL/uL (ref 3.87–5.11)
WBC: 6.6 10*3/uL (ref 4.0–10.5)

## 2013-10-10 LAB — POCT I-STAT TROPONIN I: Troponin i, poc: 0 ng/mL (ref 0.00–0.08)

## 2013-10-10 MED ORDER — KETOROLAC TROMETHAMINE 60 MG/2ML IM SOLN
60.0000 mg | Freq: Once | INTRAMUSCULAR | Status: DC
  Filled 2013-10-10: qty 2

## 2013-10-10 MED ORDER — KETOROLAC TROMETHAMINE 30 MG/ML IJ SOLN
30.0000 mg | Freq: Once | INTRAMUSCULAR | Status: AC
  Administered 2013-10-10: 30 mg via INTRAVENOUS
  Filled 2013-10-10: qty 1

## 2013-10-10 MED ORDER — METHOCARBAMOL 500 MG PO TABS: 500.0000 mg | ORAL_TABLET | Freq: Two times a day (BID) | ORAL | Status: DC

## 2013-10-10 MED ORDER — MELOXICAM 7.5 MG PO TABS: 7.5000 mg | ORAL_TABLET | Freq: Every day | ORAL | Status: DC

## 2013-10-10 NOTE — ED Notes (Signed)
Pt reports intermittent left sided chest pain today with nausea and SOB. Pt then sts "I am always short of breath due to anxiety". Pt reports having severe anxiety and panic attacks and sts it's getting worse. Pt reports taking xanax today which relieved her symptoms for awhile. Pt is tearful during assessment.

## 2013-10-10 NOTE — ED Provider Notes (Signed)
CSN: [id number]     Arrival date & time 10/10/13  1808 History   First MD Initiated Contact with Patient 10/10/13 1830     Chief Complaint  Patient presents with  . Chest Pain   (Consider location/radiation/quality/duration/timing/severity/associated sxs/prior Treatment) HPI Comments: Patient here with intermittent left anterior chest pain.  She states that this is associated with shortness of breath, nausea but no vomiting.  She states that she also feels anxious as well.  She reports no radiation of the pain and that it comes and goes.  This pain started yesterday.  Patient is a 37 y.o. female presenting with chest pain. The history is provided by the patient. No language interpreter was used.  Chest Pain Pain location:  L chest Pain quality: sharp, shooting and stabbing   Pain radiates to:  Does not radiate Pain radiates to the back: no   Onset quality:  Sudden Timing:  Intermittent Progression:  Worsening Chronicity:  New Context: breathing and stress   Relieved by:  Nothing Worsened by:  Nothing tried Ineffective treatments:  None tried Associated symptoms: anxiety, nausea and shortness of breath   Associated symptoms: no abdominal pain, no anorexia, no back pain, no cough, no diaphoresis, no dizziness, no fatigue, no fever, no headache, no near-syncope, no orthopnea, no palpitations, no syncope, not vomiting and no weakness     Past Medical History  Diagnosis Date  . Anxiety    History reviewed. No pertinent past surgical history. Family History  Problem Relation Age of Onset  . Cancer Mother   . Cancer Other    History  Substance Use Topics  . Smoking status: Current Every Day Smoker -- 1.00 packs/day    Types: Cigarettes  . Smokeless tobacco: Not on file  . Alcohol Use: Yes     Comment: social   OB History   Grav Para Term Preterm Abortions TAB SAB Ect Mult Living                 Review of Systems  Constitutional: Negative for fever, diaphoresis and  fatigue.  Respiratory: Positive for shortness of breath. Negative for cough.   Cardiovascular: Positive for chest pain. Negative for palpitations, orthopnea, syncope and near-syncope.  Gastrointestinal: Positive for nausea. Negative for vomiting, abdominal pain and anorexia.  Musculoskeletal: Negative for back pain.  Neurological: Negative for dizziness, weakness and headaches.  All other systems reviewed and are negative.    Allergies  Review of patient's allergies indicates no known allergies.  Home Medications   Current Outpatient Rx  Name  Route  Sig  Dispense  Refill  . ALPRAZolam (XANAX) 0.25 MG tablet   Oral   Take 1 tablet (0.25 mg total) by mouth 3 (three) times daily as needed for anxiety.   10 tablet   0   . levonorgestrel (MIRENA) 20 MCG/24HR IUD   Intrauterine   1 each by Intrauterine route continuous.          BP 107/59  Pulse 71  Temp(Src) 98.3 F (36.8 C) (Oral)  Resp 17  SpO2 100%  LMP 10/03/2013 Physical Exam  Nursing note and vitals reviewed. Constitutional: She is oriented to person, place, and time. She appears well-developed and well-nourished. No distress.  HENT:  Head: Normocephalic and atraumatic.  Right Ear: External ear normal.  Left Ear: External ear normal.  Nose: Nose normal.  Mouth/Throat: Oropharynx is clear and moist. No oropharyngeal exudate.  Eyes: Conjunctivae are normal. Pupils are equal, round, and reactive to light. No  scleral icterus.  Neck: Normal range of motion. Neck supple. No JVD present.  Cardiovascular: Normal rate, regular rhythm and normal heart sounds.  Exam reveals no gallop and no friction rub.   No murmur heard. Pulmonary/Chest: Breath sounds normal. No respiratory distress. She has no wheezes. She has no rales. She exhibits tenderness.  Pain reprodicable with palpation of left chest wall  Abdominal: Soft. Bowel sounds are normal. She exhibits no distension and no mass. There is no tenderness. There is no rebound  and no guarding.  Musculoskeletal: Normal range of motion. She exhibits no edema and no tenderness.  Lymphadenopathy:    She has no cervical adenopathy.  Neurological: She is alert and oriented to person, place, and time. She exhibits normal muscle tone. Coordination normal.  Skin: Skin is warm and dry. No rash noted. No erythema. No pallor.  Psychiatric: She has a normal mood and affect. Her behavior is normal. Judgment and thought content normal.    ED Course  Procedures (including critical care time) Labs Review Labs Reviewed  CBC WITH DIFFERENTIAL - Abnormal; Notable for the following:    Neutrophils Relative % 41 (*)    Lymphocytes Relative 47 (*)    All other components within normal limits  POCT I-STAT, CHEM 8 - Abnormal; Notable for the following:    Calcium, Ion 1.25 (*)    All other components within normal limits  POCT I-STAT TROPONIN I   Imaging Review Dg Chest 2 View  10/10/2013   CLINICAL DATA:  Chest pain  EXAM: CHEST  2 VIEW  COMPARISON:  04/11/2012  FINDINGS: Heart size and vascularity are normal. Lungs are clear without infiltrate effusion or mass. No change from the prior study.  IMPRESSION: No active cardiopulmonary disease.   Electronically Signed   By: Marlan Palau M.D.   On: 10/10/2013 20:12    EKG Interpretation   None      Results for orders placed during the hospital encounter of 10/10/13  CBC WITH DIFFERENTIAL      Result Value Range   WBC 6.6  4.0 - 10.5 K/uL   RBC 4.47  3.87 - 5.11 MIL/uL   Hemoglobin 12.9  12.0 - 15.0 g/dL   HCT 78.2  95.6 - 21.3 %   MCV 85.2  78.0 - 100.0 fL   MCH 28.9  26.0 - 34.0 pg   MCHC 33.9  30.0 - 36.0 g/dL   RDW 08.6  57.8 - 46.9 %   Platelets 272  150 - 400 K/uL   Neutrophils Relative % 41 (*) 43 - 77 %   Neutro Abs 2.7  1.7 - 7.7 K/uL   Lymphocytes Relative 47 (*) 12 - 46 %   Lymphs Abs 3.1  0.7 - 4.0 K/uL   Monocytes Relative 7  3 - 12 %   Monocytes Absolute 0.4  0.1 - 1.0 K/uL   Eosinophils Relative 5  0  - 5 %   Eosinophils Absolute 0.3  0.0 - 0.7 K/uL   Basophils Relative 1  0 - 1 %   Basophils Absolute 0.1  0.0 - 0.1 K/uL  POCT I-STAT, CHEM 8      Result Value Range   Sodium 143  135 - 145 mEq/L   Potassium 3.5  3.5 - 5.1 mEq/L   Chloride 106  96 - 112 mEq/L   BUN 11  6 - 23 mg/dL   Creatinine, Ser 6.29  0.50 - 1.10 mg/dL   Glucose, Bld 77  70 -  99 mg/dL   Calcium, Ion 1.61 (*) 1.12 - 1.23 mmol/L   TCO2 22  0 - 100 mmol/L   Hemoglobin 13.6  12.0 - 15.0 g/dL   HCT 09.6  04.5 - 40.9 %  POCT I-STAT TROPONIN I      Result Value Range   Troponin i, poc 0.00  0.00 - 0.08 ng/mL   Comment 3            Dg Chest 2 View  10/10/2013   CLINICAL DATA:  Chest pain  EXAM: CHEST  2 VIEW  COMPARISON:  04/11/2012  FINDINGS: Heart size and vascularity are normal. Lungs are clear without infiltrate effusion or mass. No change from the prior study.  IMPRESSION: No active cardiopulmonary disease.   Electronically Signed   By: Marlan Palau M.D.   On: 10/10/2013 20:12      MDM  Chest wall pain  Patient with history of anxiety and intermittent chest pain presents with reproducible chest pain.  EKG normal, chest x-ray normal and normal troponin, I do not suspect ACS, PE, will give short course of pain medication.   Izola Price Marisue Humble, PA-C 10/14/13 1448

## 2013-10-10 NOTE — ED Notes (Addendum)
Pt reports intermittent chest pain, states today she experienced intermittent left sided pain, about 1 hour ago pain became constant and more severe. States chest pain feels sharp, dull and achy. Tingling sensations to left arm. Reports hx of chest pain but today "feels more severe". Pt denies nausea, but states she does not feel right.

## 2013-10-10 NOTE — ED Provider Notes (Signed)
EKG independently reviewed by myself: Normal sinus rhythm with a rate of 72, no evidence of acute ST elevation or ischemia, normal EKG  Shon Baton, MD 10/10/13 2222

## 2013-10-14 ENCOUNTER — Encounter (HOSPITAL_COMMUNITY): Payer: Self-pay | Admitting: Emergency Medicine

## 2013-10-14 ENCOUNTER — Emergency Department (HOSPITAL_COMMUNITY)
Admission: EM | Admit: 2013-10-14 | Discharge: 2013-10-14 | Disposition: A | Discharge status: Home / Self Care | Payer: Medicaid Other | Attending: Emergency Medicine | Admitting: Emergency Medicine

## 2013-10-14 DIAGNOSIS — R6889 Other general symptoms and signs: Secondary | ICD-10-CM

## 2013-10-14 DIAGNOSIS — F411 Generalized anxiety disorder: Secondary | ICD-10-CM | POA: Insufficient documentation

## 2013-10-14 DIAGNOSIS — R221 Localized swelling, mass and lump, neck: Secondary | ICD-10-CM

## 2013-10-14 DIAGNOSIS — Z79899 Other long term (current) drug therapy: Secondary | ICD-10-CM | POA: Insufficient documentation

## 2013-10-14 DIAGNOSIS — F17200 Nicotine dependence, unspecified, uncomplicated: Secondary | ICD-10-CM | POA: Insufficient documentation

## 2013-10-14 NOTE — ED Provider Notes (Addendum)
CSN: [id number]     Arrival date & time 10/14/13  1323 History   First MD Initiated Contact with Patient 10/14/13 1404     Chief Complaint  Patient presents with  . feels like something stuck in throat    (Consider location/radiation/quality/duration/timing/severity/associated sxs/prior Treatment) The history is provided by the patient.  pt indicates approximately 2 weeks ago had a 'flu'-like upper respiratory illness w sore throat, nasal congestion, non prod cough. Those symptoms have resolved, but notes in past 1-2 weeks since throat seems midly scratchy, and at times as if something is in throat/stuck.  Pt denies foreign body sensation or having anything get stuck while eating or swallowing. Denies feels as if she swallowed anything hard, or bone-like. Is able to eat, drink, swallow, without difficulty. No throat pain. Symptoms are bilateral, no focal or unilateral swelling or scratchy-sensation. Denies fever or chills. Denies hx thyroid disease, but states saw her doctor this week and had thyroid tests sent but not back. No oral/dental pain. No neck mass/growth. No cough or sob.     Past Medical History  Diagnosis Date  . Anxiety    History reviewed. No pertinent past surgical history. Family History  Problem Relation Age of Onset  . Cancer Mother   . Cancer Other    History  Substance Use Topics  . Smoking status: Current Every Day Smoker -- 1.00 packs/day    Types: Cigarettes  . Smokeless tobacco: Not on file  . Alcohol Use: Yes     Comment: social   OB History   Grav Para Term Preterm Abortions TAB SAB Ect Mult Living                 Review of Systems  Constitutional: Negative for fever.  HENT: Negative for sore throat and trouble swallowing.   Eyes: Negative for redness.  Respiratory: Negative for cough and shortness of breath.   Cardiovascular: Negative for chest pain.  Gastrointestinal: Negative for abdominal pain.  Genitourinary: Negative for flank pain.   Musculoskeletal: Negative for neck pain.  Skin: Negative for rash.  Neurological: Negative for headaches.  Hematological: Negative for adenopathy.    Allergies  Review of patient's allergies indicates no known allergies.  Home Medications   Current Outpatient Rx  Name  Route  Sig  Dispense  Refill  . ALPRAZolam (XANAX) 0.25 MG tablet   Oral   Take 0.25 mg by mouth daily.         Marland Kitchen escitalopram (LEXAPRO) 10 MG tablet   Oral   Take 10 mg by mouth daily.         Marland Kitchen levonorgestrel (MIRENA) 20 MCG/24HR IUD   Intrauterine   1 each by Intrauterine route continuous.          BP 118/62  Pulse 53  Temp(Src) 98.1 F (36.7 C) (Oral)  Resp 18  Ht 5' 5.5" (1.664 m)  Wt 157 lb (71.215 kg)  BMI 25.72 kg/m2  SpO2 98%  LMP 10/03/2013 Physical Exam  Nursing note and vitals reviewed. Constitutional: She is oriented to person, place, and time. She appears well-developed and well-nourished. No distress.  HENT:  Nose: Nose normal.  Mouth/Throat: Oropharynx is clear and moist. No oropharyngeal exudate.  No oral mass seen or felt. No pharyngitis or abscess. No trismus.   Eyes: Conjunctivae are normal. No scleral icterus.  Neck: Neck supple. No tracheal deviation present. No thyromegaly present.  No neck mass or sts felt.   Cardiovascular: Normal rate.   Pulmonary/Chest:  Effort normal and breath sounds normal. No stridor. No respiratory distress.  Abdominal: Normal appearance.  Musculoskeletal: She exhibits no edema.  Lymphadenopathy:    She has no cervical adenopathy.  Neurological: She is alert and oriented to person, place, and time.  Skin: Skin is warm and dry. No rash noted. She is not diaphoretic.  Psychiatric: She has a normal mood and affect.    ED Course  Procedures (including critical care time)  EKG Interpretation   None       MDM  Reviewed nursing notes and prior charts for additional history.   Pt with normal vitals, normal exam. Is able to swallow  without difficulty, no trouble breathing.  Pt appears stable for d/c.  Discussed as strange sensation in throat/neck for past 1-2 weeks, will have f/u ent in coming week for recheck, possible direct visualization/scope if symptoms fail to improve/resolve.       Suzi Roots, MD 10/14/13 [phone]

## 2013-10-14 NOTE — ED Notes (Signed)
Pt states couple weeks ago had a cold/scratchy throat, states the past week she's felt like something is stuck in her throat, pt denies any trouble breathing or swallowing, states she doesn't have an appetite, states her body doesn't tell her she's hungry, states she went to her PCP on Tuesday and they checked her thyroid but she hasn't heard the results, denies having anything stuck in throat, denies sore throat.

## 2013-10-14 NOTE — ED Provider Notes (Signed)
Medical screening examination/treatment/procedure(s) were performed by non-physician practitioner and as supervising physician I was immediately available for consultation/collaboration.  EKG Interpretation   None        Courtney F Horton, MD 10/14/13 1839 

## 2014-01-03 ENCOUNTER — Emergency Department (HOSPITAL_COMMUNITY)
Admission: EM | Admit: 2014-01-03 | Discharge: 2014-01-03 | Disposition: A | Discharge status: Home / Self Care | Payer: Medicaid Other | Attending: Emergency Medicine | Admitting: Emergency Medicine

## 2014-01-03 ENCOUNTER — Encounter (HOSPITAL_COMMUNITY): Payer: Self-pay | Admitting: Emergency Medicine

## 2014-01-03 DIAGNOSIS — K029 Dental caries, unspecified: Secondary | ICD-10-CM | POA: Insufficient documentation

## 2014-01-03 DIAGNOSIS — Z975 Presence of (intrauterine) contraceptive device: Secondary | ICD-10-CM | POA: Insufficient documentation

## 2014-01-03 DIAGNOSIS — R51 Headache: Secondary | ICD-10-CM | POA: Insufficient documentation

## 2014-01-03 DIAGNOSIS — Z792 Long term (current) use of antibiotics: Secondary | ICD-10-CM | POA: Insufficient documentation

## 2014-01-03 DIAGNOSIS — F411 Generalized anxiety disorder: Secondary | ICD-10-CM | POA: Insufficient documentation

## 2014-01-03 DIAGNOSIS — F17200 Nicotine dependence, unspecified, uncomplicated: Secondary | ICD-10-CM | POA: Insufficient documentation

## 2014-01-03 DIAGNOSIS — Z79899 Other long term (current) drug therapy: Secondary | ICD-10-CM | POA: Insufficient documentation

## 2014-01-03 MED ORDER — HYDROCODONE-ACETAMINOPHEN 5-325 MG PO TABS: ORAL_TABLET | ORAL | Status: DC

## 2014-01-03 MED ORDER — NAPROXEN 250 MG PO TABS: 250.0000 mg | ORAL_TABLET | Freq: Two times a day (BID) | ORAL | Status: DC

## 2014-01-03 NOTE — ED Notes (Signed)
Pt c/o pain right side of head/ear/jaw since 0230

## 2014-01-03 NOTE — ED Notes (Signed)
Pt started antibiotics for toothache on Friday

## 2014-01-03 NOTE — ED Notes (Signed)
Pt started antibiotics on Friday for a toothache; multiple dental cavities per patient

## 2014-01-03 NOTE — ED Provider Notes (Signed)
CSN: [id number]     Arrival date & time 01/03/14  0447 History   First MD Initiated Contact with Patient 01/03/14 [id number]     Chief Complaint  Patient presents with  . Headache  . Dental Pain    HPI Pt was seen at 0515. Per pt, c/o gradual onset and persistence of constant right lower tooth "pain" since this morning. States the pain radiates into the front of her jaw and up the side of her face. States she was seen by her dentist 3 days ago for same, rx abx for dental caries but no pain meds. Has been taking motrin without relief. Denies fevers, no intra-oral edema, no rash, no facial swelling, no dysphagia, no neck pain. The condition is aggravated by nothing. The condition is relieved by nothing. The symptoms have been associated with no other complaints. The patient has no significant history of serious medical conditions.     Past Medical History  Diagnosis Date  . Anxiety    History reviewed. No pertinent past surgical history.  Family History  Problem Relation Age of Onset  . Cancer Mother   . Cancer Other    History  Substance Use Topics  . Smoking status: Current Every Day Smoker -- 1.00 packs/day    Types: Cigarettes  . Smokeless tobacco: Not on file  . Alcohol Use: Yes     Comment: social    Review of Systems ROS: Statement: All systems negative except as marked or noted in the HPI; Constitutional: Negative for fever and chills. ; ; Eyes: Negative for eye pain and discharge. ; ; ENMT: Positive for dental caries, dental hygiene poor and toothache. Negative for ear pain, bleeding gums, dental injury, facial deformity, facial swelling, hoarseness, nasal congestion, sinus pressure, sore throat, throat swelling and tongue swollen. ; ; Cardiovascular: Negative for chest pain, palpitations, diaphoresis, dyspnea and peripheral edema. ; ; Respiratory: Negative for cough, wheezing and stridor. ; ; Gastrointestinal: Negative for nausea, vomiting, diarrhea and abdominal pain. ; ;  Genitourinary: Negative for dysuria, flank pain and hematuria. ; ; Musculoskeletal: Negative for back pain and neck pain. ; ; Skin: Negative for rash and skin lesion. ; ; Neuro: Negative for headache, lightheadedness and neck stiffness. ;      Allergies  Review of patient's allergies indicates no known allergies.  Home Medications   Current Outpatient Rx  Name  Route  Sig  Dispense  Refill  . ALPRAZolam (XANAX) 0.25 MG tablet   Oral   Take 0.25 mg by mouth 3 (three) times daily as needed for anxiety.          Marland Kitchen. amoxicillin-clavulanate (AUGMENTIN) 250-125 MG per tablet   Oral   Take 1 tablet by mouth 2 (two) times daily.         Marland Kitchen. escitalopram (LEXAPRO) 10 MG tablet   Oral   Take 10 mg by mouth daily.         Marland Kitchen. levonorgestrel (MIRENA) 20 MCG/24HR IUD   Intrauterine   1 each by Intrauterine route continuous.         Marland Kitchen. HYDROcodone-acetaminophen (NORCO/VICODIN) 5-325 MG per tablet      1 or 2 tabs PO q6 hours prn pain   20 tablet   0    BP 106/70  Pulse 85  Temp(Src) 98.8 F (37.1 C)  Resp 20  SpO2 98%  LMP 12/13/2013 Physical Exam 0520: Physical examination: Vital signs and O2 SAT: Reviewed; Constitutional: Well developed, Well nourished, Well hydrated, In no  acute distress; Head and Face: Normocephalic, Atraumatic.; Eyes: EOMI, PERRL, No scleral icterus; ENMT: Mouth and pharynx normal, Poor dentition, Widespread dental decay, Left TM normal, Right TM normal, Mucous membranes moist, +upper and lower right 1st and 2nd molars with extensive dental decay. No gingival erythema, edema, fluctuance, or drainage.  No intra-oral edema. No submandibular or sublingual edema. No hoarse voice, no drooling, no stridor.  ; Neck: Supple, Full range of motion, No lymphadenopathy; Cardiovascular: Regular rate and rhythm, No murmur, rub, or gallop; Respiratory: Breath sounds clear & equal bilaterally, No rales, rhonchi, wheezes, Normal respiratory effort/excursion; Chest: Nontender,  Movement normal; Extremities: Pulses normal, No tenderness, No edema; Neuro: AA&Ox3, Major CN grossly intact. No facial droop.  No gross focal motor or sensory deficits in extremities. Climbs on and off stretcher easily by herself. Gait steady.; Skin: Color normal, No rash, No petechiae, Warm, Dry   ED Course  Procedures   EKG Interpretation   None       MDM  MDM Reviewed: previous chart, nursing note and vitals    0540:  Pt already taking abx, will add pain meds today. Pt encouraged to f/u with dentist or oral surgeon for her dental needs for good continuity of care and definitive treatment.  Verb understanding.      Laray Anger, DO 01/03/14 1605

## 2014-01-03 NOTE — Discharge Instructions (Signed)
°Emergency Department Resource Guide °1) Find a Doctor and Pay Out of Pocket °Although you won't have to find out who is covered by your insurance plan, it is a good idea to ask around and get recommendations. You will then need to call the office and see if the doctor you have chosen will accept you as a new patient and what types of options they offer for patients who are self-pay. Some doctors offer discounts or will set up payment plans for their patients who do not have insurance, but you will need to ask so you aren't surprised when you get to your appointment. ° °2) Contact Your Local Health Department °Not all health departments have doctors that can see patients for sick visits, but many do, so it is worth a call to see if yours does. If you don't know where your local health department is, you can check in your phone book. The CDC also has a tool to help you locate your state's health department, and many state websites also have listings of all of their local health departments. ° °3) Find a Walk-in Clinic °If your illness is not likely to be very severe or complicated, you may want to try a walk in clinic. These are popping up all over the country in pharmacies, drugstores, and shopping centers. They're usually staffed by nurse practitioners or physician assistants that have been trained to treat common illnesses and complaints. They're usually fairly quick and inexpensive. However, if you have serious medical issues or chronic medical problems, these are probably not your best option. ° °No Primary Care Doctor: °- Call [facility] at  832-8000 - they can help you locate a primary care doctor that  accepts your insurance, provides certain services, etc. °- Physician Referral Service- 1-800-533-3463 ° °Chronic Pain Problems: °Organization         Address  Phone   Notes  °[facility]  (336) 297-2271 Patients need to be referred by their primary care doctor.  ° °Medication  Assistance: °Organization         Address  Phone   Notes  °[place] [facility] [address] °[place], [place] [address] (336) 641-8030 --Must be a resident of [place] °-- Must have NO insurance coverage whatsoever (no Medicaid/ Medicare, etc.) °-- The pt. MUST have a primary care doctor that directs their care regularly and follows them in the community °  °[facility]  (866) 331-1348   °[employer]  (888) 892-1162   ° °Agencies that provide inexpensive medical care: °Organization         Address  Phone   Notes  °[facility]  (336) 832-8035   °[facility]    (336) 832-7272   °[facility] [address] °[place], [place] [address] (336) 832-4777   °[facility] [place] [address], °[place] (336) 271-4999   °[facility]    (336) 373-0678   °[facility]    (336) 272-1050   °[facility] ° [address], [place] Phone:  (336) 832-4444, Fax:  (336) 832-4440 Hours of Operation:  9 am - 6 pm, M-F.  Also accepts Medicaid/Medicare and self-pay.  °[facility] ° [address], [place] Phone: (336) 832-3150, Fax: (336) 832-3151. Hours of Operation:  8:30 am - 5:30 pm, M-F.  Also accepts Medicaid and self-pay.  °[facility] [place] [address], [place] Phone: (336) 878-6027   °[facility] [address], [place], [place] (336)723-1848, Ext. 123 Mondays & Thursdays: 7-9 AM.  First 15 patients are seen on a first come, first serve basis. °  ° °Medicaid-accepting [place] Providers: ° °Organization         Address  Phone   Notes  °[facility] [address], [place] (336) 641-2100 Also accepts self-pay patients.  °[facility] [address], [place] ° (336) 856-9996   °[facility] [address], [place]  (336) 288-8857   °[facility] [address] [place] [address], [place] (336) 299-7000   °Veita Bland [address], [place]  ° (336) 373-1557 Only accepts [place] Access Medicaid patients after they have their name applied to their card.  ° °Self-Pay (no insurance) in [place]: ° °Organization         Address  Phone   Notes  °Sickle Cell Patients, [facility] [address], [place] (336) 832-1970   °[facility] [address], [place] (336) 832-4400   °[facility] ° [address], [place] (336) 992-4800   °[facility]/Dr. Osei-Bonsu ° [address] [place] [address], [place] or [address], [place] (336) 841-8500 Phone number for both [place] and [place] locations is the same.  °[facility] [address], [place] (336) 299-0000   °[facility] [address] [place] [address], [place] or [address] (336) 852-7530 °(336) 878-2260   °[facility] [address], [place] (336) 350-1642, phone; (336) 294-5005, fax Sees patients 1st and 3rd Saturday of every month.  Must not qualify for public or private insurance (i.e. Medicaid, Medicare, [place] Health Choice, Veterans' Benefits) • Household income should be no more than 200% of the poverty level •The clinic cannot treat you if you are pregnant or think you are pregnant • Sexually transmitted diseases are not treated at the clinic.  ° ° °Dental Care: °Organization         Address  Phone  Notes  °[place] [facility] [address], [place] (336) 641-6152 Accepts children up to age 21 who are enrolled in Medicaid or [place] Health Choice; pregnant women with a Medicaid card; and children who have applied for Medicaid or [place] Health Choice, but were declined, whose parents can pay a reduced fee at time of service.  °[place]  [facility] [place]  [address], [place] (336) 641-7733 Accepts children up to age 21 who are enrolled in Medicaid or [place] Health Choice; pregnant women with a Medicaid card; and children who have applied for Medicaid or  Health Choice, but were declined, whose parents can pay a reduced fee at time of service.  °[facility] ° [address], [place] (336) 641-4533 Patients are seen by appointment only. Walk-ins are not accepted. [facility] will see patients 18 years of age and older. °Monday - Tuesday (8am-5pm) °Most Wednesdays (8:30-5pm) °$30 per visit, cash only  °[facility] ° [address], [place] (336) 641-4533 Patients are seen by appointment only. Walk-ins are not accepted. [facility] will see patients 18 years of age and older. °One   Wednesday Evening (Monthly: Volunteer Based).  $30 per visit, cash only  °[facility]  (919) 537-3737 for adults; Children under age 4, call Graduate Pediatric Dentistry at (919) [phone]. Children aged 4-14, please call (919) 537-3737 to request a pediatric application. ° Dental services are provided in all areas of dental care including fillings, crowns and bridges, complete and partial dentures, implants, gum treatment, root canals, and extractions. Preventive care is also provided. Treatment is provided to both adults and children. °Patients are selected via a lottery and there is often a waiting list. °  °[facility] [address], °[place] ° (336) 763-8833 [email or web address].[email or web address] °  °[facility] [address], [place], [place] (336)723-1848, Ext. 123 Second and Fourth Thursday of each month, opens at 6:30 AM; Clinic ends at 9 AM.  Patients are seen on a first-come first-served basis, and a limited number are seen during each clinic.  ° °[facility] ° [address], [place], [place] (336) 723-7904    Eligibility Requirements °You must have lived in [place], [place], or [place] counties for at least the last three months. °  You cannot be eligible for state or federal sponsored healthcare insurance, including Veterans Administration, Medicaid, or Medicare. °  You generally cannot be eligible for healthcare insurance through your employer.  °  How to apply: °Eligibility screenings are held every Tuesday and Wednesday afternoon from 1:00 pm until 4:00 pm. You do not need an appointment for the interview!  °[facility] [address], [place], [place] 336-631-2330   °[facility]  336-342-8273   °[facility]  336-703-3100   °[facility]  336-570-6415   ° °Behavioral Health Resources in the Community: °Intensive Outpatient Programs °Organization         Address  Phone  Notes  °[facility] [address], [place], [place] 336-878-6098   °[facility] [address], [place], [place] 336-832-9800   °[facility] [address], [place], [place] ° 336-882-2125   °[facility] [address],  °[place], [place] 1-800-853-5163 or 336-641-4981   °Substance Abuse Resources °Organization         Address  Phone  Notes  °[facility]  336-882-2125   °[facility]  336-784-9470   °[facility]  336-285-9073   °[facility]  336-845-3988   °Residential & Outpatient Substance Abuse Program  1-800-659-3381   °Psychological Services °Organization         Address  Phone  Notes  °[facility]  336- 832-9600   °[facility]  336- 378-7881   °[facility] [address], [place] 1-800-853-5163 or 336-641-4981   ° °Mobile Crisis Teams °Organization         Address  Phone  Notes  °[facility], Mobile Crisis Care Unit  1-877-626-1772   °Assertive °[facility] ° [address]  [place], [place] 336-834-9664   °Sharon DeEsch [address] °[place] 336-554-5454   ° °Self-Help/Support Groups °Organization         Address  Phone             Notes  °[facility] [place] - variety of support groups  336- 373-1402 Call for more information  °Narcotics Anonymous (NA), [facility] [address], °[place]  2 meetings at this location  ° °  Residential Treatment Programs °Organization         Address  Phone  Notes  °[facility] [address],    °[place]  1-866-801-8205   °[facility] ° [address], [place], [place] 704-293-8524   °[facility] [address], [place] 336-845-3988 Admissions: 8am-3pm M-F  °[facility] [address]    °[place], [place] 336-841-1104   °[facility] [address], [place], [place] 336-379-7146   °[facility] [address]  °[place], [place] 336-285-9073   °[facility] Intensive Outpatient [address], [place], [place] 336-852-3033   °[facility] [address]  °[place], [place] 1-877-615-2722 or 336-784-9470   °[facility] [address] [place], [place] 336-227-7417 Accepts Medicaid  °[facility] [address]  °[place] 1-800-659-3381 Substance Abuse/Addiction Treatment  ° °[place] Behavioral Health Resources °Organization         Address  Phone  Notes  °[facility]  (888) 581-9988   °Julie Brannon, PhD [address] [place], [place]   (336) 349-5553 or (336) 951-0000   °[facility]   [address] °[place], [place] (336) 349-4454   °[facility] [address], [place], [place] (336) 342-8316 Insurance/Medicaid/sponsorship through [facility]  °[facility] [address]                                    [place], [place] (336) 342-8316 Therapy/tele-psych/case    °[facility] [address]  ° [place], [place] (336) 349-2233    °Dr. Arfeen  (336) 349-4544   °[facility] [place]  [employer] [place] [facility] 1) [address], [place] °2) [address], [place] °3)  [address], [place] (336) 349-3220 °(336) 342-7768 ° °(336) 342-8140   °[place] [facility] (336) 342-1394 or (336) 342-3537 (After Hours)    ° ° ° °Take the prescriptions as directed.  Call your regular dentist today to schedule a follow up appointment within the week.  Return to the Emergency Department immediately sooner if worsening.  ° °

## 2014-03-17 ENCOUNTER — Emergency Department (HOSPITAL_COMMUNITY)
Admission: EM | Admit: 2014-03-17 | Discharge: 2014-03-17 | Disposition: A | Discharge status: Home / Self Care | Payer: Medicaid Other | Attending: Emergency Medicine | Admitting: Emergency Medicine

## 2014-03-17 ENCOUNTER — Emergency Department (HOSPITAL_COMMUNITY): Payer: Medicaid Other

## 2014-03-17 ENCOUNTER — Encounter (HOSPITAL_COMMUNITY): Payer: Self-pay | Admitting: Emergency Medicine

## 2014-03-17 DIAGNOSIS — Z79899 Other long term (current) drug therapy: Secondary | ICD-10-CM | POA: Insufficient documentation

## 2014-03-17 DIAGNOSIS — R0789 Other chest pain: Secondary | ICD-10-CM

## 2014-03-17 DIAGNOSIS — R071 Chest pain on breathing: Secondary | ICD-10-CM | POA: Insufficient documentation

## 2014-03-17 DIAGNOSIS — F17200 Nicotine dependence, unspecified, uncomplicated: Secondary | ICD-10-CM | POA: Insufficient documentation

## 2014-03-17 DIAGNOSIS — Z3202 Encounter for pregnancy test, result negative: Secondary | ICD-10-CM | POA: Insufficient documentation

## 2014-03-17 DIAGNOSIS — F411 Generalized anxiety disorder: Secondary | ICD-10-CM | POA: Insufficient documentation

## 2014-03-17 LAB — CBC
HCT: 40.6 % (ref 36.0–46.0)
Hemoglobin: 13.6 g/dL (ref 12.0–15.0)
MCH: 28.9 pg (ref 26.0–34.0)
MCHC: 33.5 g/dL (ref 30.0–36.0)
MCV: 86.4 fL (ref 78.0–100.0)
Platelets: 255 10*3/uL (ref 150–400)
RBC: 4.7 MIL/uL (ref 3.87–5.11)
RDW: 13.2 % (ref 11.5–15.5)
WBC: 7.3 10*3/uL (ref 4.0–10.5)

## 2014-03-17 LAB — URINALYSIS, ROUTINE W REFLEX MICROSCOPIC
Bilirubin Urine: NEGATIVE
Glucose, UA: NEGATIVE mg/dL
Ketones, ur: NEGATIVE mg/dL
Leukocytes, UA: NEGATIVE
Nitrite: NEGATIVE
Protein, ur: NEGATIVE mg/dL
Specific Gravity, Urine: 1.025 (ref 1.005–1.030)
Urobilinogen, UA: 0.2 mg/dL (ref 0.0–1.0)
pH: 6 (ref 5.0–8.0)

## 2014-03-17 LAB — URINE MICROSCOPIC-ADD ON

## 2014-03-17 LAB — BASIC METABOLIC PANEL
BUN: 10 mg/dL (ref 6–23)
CO2: 23 mEq/L (ref 19–32)
Calcium: 9.4 mg/dL (ref 8.4–10.5)
Chloride: 106 mEq/L (ref 96–112)
Creatinine, Ser: 0.6 mg/dL (ref 0.50–1.10)
GFR calc Af Amer: >90 mL/min (ref >90)
GFR calc non Af Amer: >90 mL/min (ref >90)
Glucose, Bld: 87 mg/dL (ref 70–99)
Potassium: 4.1 mEq/L (ref 3.7–5.3)
Sodium: 140 mEq/L (ref 137–147)

## 2014-03-17 LAB — PREGNANCY, URINE: Preg Test, Ur: NEGATIVE

## 2014-03-17 LAB — I-STAT TROPONIN, ED: Troponin i, poc: 0 ng/mL (ref 0.00–0.08)

## 2014-03-17 MED ORDER — HYDROCODONE-ACETAMINOPHEN 5-325 MG PO TABS: 1.0000 | ORAL_TABLET | Freq: Four times a day (QID) | ORAL | Status: DC | PRN

## 2014-03-17 MED ORDER — SODIUM CHLORIDE 0.9 % IV BOLUS (SEPSIS)
1000.0000 mL | Freq: Once | INTRAVENOUS | Status: AC
  Administered 2014-03-17: 1000 mL via INTRAVENOUS

## 2014-03-17 MED ORDER — IBUPROFEN 800 MG PO TABS: 800.0000 mg | ORAL_TABLET | Freq: Three times a day (TID) | ORAL | Status: DC | PRN

## 2014-03-17 MED ORDER — KETOROLAC TROMETHAMINE 30 MG/ML IJ SOLN
30.0000 mg | Freq: Once | INTRAMUSCULAR | Status: AC
  Administered 2014-03-17: 30 mg via INTRAVENOUS
  Filled 2014-03-17: qty 1

## 2014-03-17 NOTE — Discharge Instructions (Signed)
Return here as needed.  Followup with your primary care doctor your testing here today was normal.  Use heat on your chest where it is sore

## 2014-03-17 NOTE — ED Provider Notes (Signed)
CSN: [id number]     Arrival date & time 03/17/14  1847 History   First MD Initiated Contact with Patient 03/17/14 1947     Chief Complaint  Patient presents with  . Chest Pain  . Weakness     (Consider location/radiation/quality/duration/timing/severity/associated sxs/prior Treatment) HPI The patient presents to the emergency department with left-sided chest pain, has been constant for one week.  She states that the pain will last for a few seconds at a time.  Patient, states, that she did not take any medications prior to arrival.  The patient denies weakness, dizziness, headache, blurred vision, nausea, vomiting, diarrhea, diaphoresis, increased thirst, rash, fever, cough, or syncope.  The patient, states, that she is a smoker, one pack per day.  Past Medical History  Diagnosis Date  . Anxiety    History reviewed. No pertinent past surgical history. Family History  Problem Relation Age of Onset  . Cancer Mother   . Cancer Other    History  Substance Use Topics  . Smoking status: Current Every Day Smoker -- 1.00 packs/day    Types: Cigarettes  . Smokeless tobacco: Not on file  . Alcohol Use: Yes     Comment: social   OB History   Grav Para Term Preterm Abortions TAB SAB Ect Mult Living                 Review of Systems All other systems negative except as documented in the HPI. All pertinent positives and negatives as reviewed in the HPI.    Allergies  Review of patient's allergies indicates no known allergies.  Home Medications   Current Outpatient Rx  Name  Route  Sig  Dispense  Refill  . escitalopram (LEXAPRO) 10 MG tablet   Oral   Take 10 mg by mouth daily.         Marland Kitchen. levonorgestrel (MIRENA) 20 MCG/24HR IUD   Intrauterine   1 each by Intrauterine route continuous.          BP 108/64  Pulse 70  Temp(Src) 98.7 F (37.1 C) (Oral)  Resp 20  SpO2 98%  LMP 03/10/2014 Physical Exam  Nursing note and vitals reviewed. Constitutional: She is oriented  to person, place, and time. She appears well-developed and well-nourished. No distress.  HENT:  Head: Normocephalic and atraumatic.  Mouth/Throat: Oropharynx is clear and moist.  Eyes: Pupils are equal, round, and reactive to light.  Cardiovascular: Normal rate, regular rhythm and normal heart sounds.  Exam reveals no gallop and no friction rub.   No murmur heard. Pulmonary/Chest: Effort normal and breath sounds normal. She exhibits tenderness.  Abdominal: Soft. Bowel sounds are normal. She exhibits no distension. There is no tenderness.  Musculoskeletal: She exhibits no edema.  Neurological: She is alert and oriented to person, place, and time.  Skin: Skin is warm and dry. No rash noted. No erythema.    ED Course  Procedures (including critical care time) Labs Review Labs Reviewed  URINALYSIS, ROUTINE W REFLEX MICROSCOPIC - Abnormal; Notable for the following:    APPearance CLOUDY (*)    Hgb urine dipstick SMALL (*)    All other components within normal limits  URINE MICROSCOPIC-ADD ON - Abnormal; Notable for the following:    Squamous Epithelial / LPF FEW (*)    All other components within normal limits  CBC  BASIC METABOLIC PANEL  PREGNANCY, URINE  I-STAT TROPOININ, ED   Imaging Review Dg Chest 2 View  03/17/2014   CLINICAL DATA:  Left chest pain.  EXAM: CHEST  2 VIEW  COMPARISON:  DG CHEST 2 VIEW dated 10/10/2013; DG CHEST 2 VIEW dated 02/28/2011  FINDINGS: Both lungs are clear. Heart and mediastinum are within normal limits. Trachea is midline. No acute bone abnormality.  IMPRESSION: No active cardiopulmonary disease.   Electronically Signed   By: Richarda Overlie M.D.   On: 03/17/2014 20:36     EKG Interpretation   Date/Time:  Friday March 17 2014 19:06:33 EDT Ventricular Rate:  68 PR Interval:  129 QRS Duration: 80 QT Interval:  434 QTC Calculation: 462 R Axis:   81 Text Interpretation:  Sinus rhythm No significant change since last  tracing Confirmed by KNAPP  MD-J, JON  ([id number]) on 03/17/2014 7:17:01 PM       Patient most likely has chest wall pain based on her physical exam findings and history of present illness.  The patient's pain lasts for seconds at a time.  It is sharp in nature.  Patient, states, that palpation makes the pain, worse.  Patient denies any calf pain, recent surgery, exertional dyspnea.  Tachycardia.  The patient is low risk based on Wells criteria.  Patient's best return here as, needed.  She'll be sent home with pain control told to followup with her primary care Dr. since her pain has been ongoing for one week he only did 1  Troponin.  She has no tachycardia.  No hypoxia and no tachypnea    Carlyle Dolly, [place] 03/17/14 2131

## 2014-03-17 NOTE — ED Notes (Signed)
Pt from home reports that she started to have CP x1 week. Pt has had episodes of SOB, but denies diaphoresis, N/V. Pt came in today because she started to have radiating pain to her neck, weak, dizzy and was awakened out of her sleep with sudden SOB. Pt is A&O and in NAD. Pt denies cough or sickness, just allergies.

## 2014-03-18 NOTE — ED Provider Notes (Signed)
Medical screening examination/treatment/procedure(s) were performed by non-physician practitioner and as supervising physician I was immediately available for consultation/collaboration.    Egor Fullilove L Lizanne Erker, MD 03/18/14 1519 

## 2014-04-19 ENCOUNTER — Encounter (HOSPITAL_COMMUNITY): Payer: Self-pay | Admitting: Emergency Medicine

## 2014-04-19 ENCOUNTER — Emergency Department (HOSPITAL_COMMUNITY): Payer: Medicaid Other

## 2014-04-19 ENCOUNTER — Emergency Department (HOSPITAL_COMMUNITY)
Admission: EM | Admit: 2014-04-19 | Discharge: 2014-04-19 | Disposition: A | Discharge status: Home / Self Care | Payer: Medicaid Other | Attending: Emergency Medicine | Admitting: Emergency Medicine

## 2014-04-19 DIAGNOSIS — F17200 Nicotine dependence, unspecified, uncomplicated: Secondary | ICD-10-CM | POA: Insufficient documentation

## 2014-04-19 DIAGNOSIS — Z3202 Encounter for pregnancy test, result negative: Secondary | ICD-10-CM | POA: Insufficient documentation

## 2014-04-19 DIAGNOSIS — R0789 Other chest pain: Secondary | ICD-10-CM

## 2014-04-19 DIAGNOSIS — R071 Chest pain on breathing: Secondary | ICD-10-CM | POA: Insufficient documentation

## 2014-04-19 DIAGNOSIS — M79609 Pain in unspecified limb: Secondary | ICD-10-CM | POA: Insufficient documentation

## 2014-04-19 DIAGNOSIS — J3489 Other specified disorders of nose and nasal sinuses: Secondary | ICD-10-CM | POA: Insufficient documentation

## 2014-04-19 DIAGNOSIS — Z79899 Other long term (current) drug therapy: Secondary | ICD-10-CM | POA: Insufficient documentation

## 2014-04-19 DIAGNOSIS — F411 Generalized anxiety disorder: Secondary | ICD-10-CM | POA: Insufficient documentation

## 2014-04-19 LAB — POC URINE PREG, ED: Preg Test, Ur: NEGATIVE

## 2014-04-19 MED ORDER — PREDNISONE 10 MG PO TABS: 20.0000 mg | ORAL_TABLET | Freq: Every day | ORAL | Status: DC

## 2014-04-19 MED ORDER — METHOCARBAMOL 750 MG PO TABS: 750.0000 mg | ORAL_TABLET | Freq: Four times a day (QID) | ORAL | Status: DC

## 2014-04-19 NOTE — ED Notes (Signed)
Patient transported to X-ray 

## 2014-04-19 NOTE — ED Provider Notes (Signed)
CSN: [id number]     Arrival date & time 04/19/14  1906 History   First MD Initiated Contact with Patient 04/19/14 1920     Chief Complaint  Patient presents with  . Arm Pain  . Nasal Congestion  . Chest Pain     (Consider location/radiation/quality/duration/timing/severity/associated sxs/prior Treatment) Patient is a 38 y.o. female presenting with arm pain and chest pain. The history is provided by the patient and a friend.  Arm Pain Associated symptoms include chest pain.  Chest Pain  patient here complaining of persistent left-sided pain that began 3 days ago after she had a very severe hiccups. Pain characterized as sharp and worse with movement or deep breath. Denies any anginal type qualities. No leg pain or swelling. She has had some cough and congestion. Does endorse recent URI symptoms. No vomiting or diarrhea. Does have a history of costochondritis in the past. No treatment used prior to arrival. Denies any rashes to her chest. No syncope or syncope.  Past Medical History  Diagnosis Date  . Anxiety    History reviewed. No pertinent past surgical history. Family History  Problem Relation Age of Onset  . Cancer Mother   . Cancer Other    History  Substance Use Topics  . Smoking status: Current Every Day Smoker -- 1.00 packs/day    Types: Cigarettes  . Smokeless tobacco: Not on file  . Alcohol Use: Yes     Comment: social   OB History   Grav Para Term Preterm Abortions TAB SAB Ect Mult Living                 Review of Systems  Cardiovascular: Positive for chest pain.  All other systems reviewed and are negative.     Allergies  Review of patient's allergies indicates no known allergies.  Home Medications   Prior to Admission medications   Medication Sig Start Date End Date Taking? Authorizing Provider  escitalopram (LEXAPRO) 10 MG tablet Take 10 mg by mouth daily.    Historical Provider, MD  HYDROcodone-acetaminophen (NORCO/VICODIN) 5-325 MG per tablet  Take 1 tablet by mouth every 6 (six) hours as needed for moderate pain. 03/17/14   Jamesetta Orleanshristopher W Lawyer, PA-C  ibuprofen (ADVIL,MOTRIN) 800 MG tablet Take 1 tablet (800 mg total) by mouth every 8 (eight) hours as needed. 03/17/14   Carlyle Dollyhristopher W Lawyer, PA-C  levonorgestrel (MIRENA) 20 MCG/24HR IUD 1 each by Intrauterine route continuous.    Historical Provider, MD   Pulse 80  Temp(Src) 98 F (36.7 C) (Oral)  Resp 18  SpO2 100%  LMP 04/12/2014 Physical Exam  Nursing note and vitals reviewed. Constitutional: She is oriented to person, place, and time. She appears well-developed and well-nourished.  Non-toxic appearance. No distress.  HENT:  Head: Normocephalic and atraumatic.  Eyes: Conjunctivae, EOM and lids are normal. Pupils are equal, round, and reactive to light.  Neck: Normal range of motion. Neck supple. No tracheal deviation present. No mass present.  Cardiovascular: Normal rate, regular rhythm and normal heart sounds.  Exam reveals no gallop.   No murmur heard. Pulmonary/Chest: Effort normal and breath sounds normal. No stridor. No respiratory distress. She has no decreased breath sounds. She has no wheezes. She has no rhonchi. She has no rales. She exhibits tenderness. She exhibits no crepitus.    Abdominal: Soft. Normal appearance and bowel sounds are normal. She exhibits no distension. There is no tenderness. There is no rebound and no CVA tenderness.  Musculoskeletal: Normal range of motion.  She exhibits no edema and no tenderness.  Neurological: She is alert and oriented to person, place, and time. She has normal strength. No cranial nerve deficit or sensory deficit. GCS eye subscore is 4. GCS verbal subscore is 5. GCS motor subscore is 6.  Skin: Skin is warm and dry. No abrasion and no rash noted.  Psychiatric: She has a normal mood and affect. Her speech is normal and behavior is normal.    ED Course  Procedures (including critical care time) Labs Review Labs Reviewed    POC URINE PREG, ED    Imaging Review Dg Chest 2 View  04/19/2014   CLINICAL DATA:  Chest pain.  EXAM: CHEST  2 VIEW  COMPARISON:  DG CHEST 2 VIEW dated 03/17/2014  FINDINGS: The lungs are well-expanded. There is no focal infiltrate. The interstitial markings are minimally prominent but stable which likely reflects the patient's smoking history. The cardiopericardial silhouette is normal in size. The pulmonary vascularity is not engorged. The mediastinum is normal in width. There is no pleural effusion or pneumothorax or pneumomediastinum. . The observed portions of the bony thorax appear normal.  IMPRESSION: There is no evidence of pneumonia nor CHF. Borderline hyperinflation with stable minimal prominence of the pulmonary interstitial markings likely reflects the patient's smoking history.   Electronically Signed   By: David  [place]   On: 04/19/2014 20:12     EKG Interpretation   Date/Time:  Wednesday April 19 2014 19:19:57 EDT Ventricular Rate:  69 PR Interval:  140 QRS Duration: 86 QT Interval:  410 QTC Calculation: 439 R Axis:   72 Text Interpretation:  Sinus rhythm Confirmed by Freida BusmanALLEN  MD, Shateka Petrea ([id number])  on 04/19/2014 7:37:09 PM      MDM   Final diagnoses:  None    Patient with negative chest x-ray. No concern for PE. Likely costochondritis and she is stable for discharge    Toy BakerAnthony T Urania Pearlman, MD 04/19/14 2038

## 2014-04-19 NOTE — Discharge Instructions (Signed)

## 2014-04-19 NOTE — ED Notes (Signed)
Per pt, with chest pain, arm pain, abdominal pain, cold like symptoms for last 3 days.  Has hx of chest wall pain.  States all began with a hiccup. Numbness/Tingling to left side but "sometimes on the right"   Nausea started about an hour ago.

## 2014-07-28 ENCOUNTER — Other Ambulatory Visit: Payer: Self-pay | Admitting: Nurse Practitioner

## 2014-07-28 DIAGNOSIS — R922 Inconclusive mammogram: Secondary | ICD-10-CM

## 2014-07-28 DIAGNOSIS — N63 Unspecified lump in unspecified breast: Secondary | ICD-10-CM

## 2014-08-01 ENCOUNTER — Ambulatory Visit
Admission: RE | Admit: 2014-08-01 | Discharge: 2014-08-01 | Disposition: A | Discharge status: Home / Self Care | Payer: Medicaid Other | Source: Ambulatory Visit | Attending: Nurse Practitioner | Admitting: Nurse Practitioner

## 2014-08-01 DIAGNOSIS — R922 Inconclusive mammogram: Secondary | ICD-10-CM

## 2014-08-01 DIAGNOSIS — N63 Unspecified lump in unspecified breast: Secondary | ICD-10-CM

## 2014-08-02 ENCOUNTER — Other Ambulatory Visit: Payer: Medicaid Other

## 2014-08-04 ENCOUNTER — Other Ambulatory Visit: Payer: Medicaid Other

## 2014-10-19 ENCOUNTER — Emergency Department (HOSPITAL_COMMUNITY): Payer: Medicaid Other

## 2014-10-19 ENCOUNTER — Emergency Department (HOSPITAL_COMMUNITY)
Admission: EM | Admit: 2014-10-19 | Discharge: 2014-10-19 | Disposition: A | Discharge status: Home / Self Care | Payer: Medicaid Other | Attending: Emergency Medicine | Admitting: Emergency Medicine

## 2014-10-19 ENCOUNTER — Encounter (HOSPITAL_COMMUNITY): Payer: Self-pay | Admitting: Emergency Medicine

## 2014-10-19 DIAGNOSIS — Z7952 Long term (current) use of systemic steroids: Secondary | ICD-10-CM | POA: Insufficient documentation

## 2014-10-19 DIAGNOSIS — Z419 Encounter for procedure for purposes other than remedying health state, unspecified: Secondary | ICD-10-CM | POA: Diagnosis not present

## 2014-10-19 DIAGNOSIS — R112 Nausea with vomiting, unspecified: Secondary | ICD-10-CM | POA: Insufficient documentation

## 2014-10-19 DIAGNOSIS — Z3202 Encounter for pregnancy test, result negative: Secondary | ICD-10-CM | POA: Insufficient documentation

## 2014-10-19 DIAGNOSIS — R079 Chest pain, unspecified: Secondary | ICD-10-CM | POA: Diagnosis present

## 2014-10-19 DIAGNOSIS — M5412 Radiculopathy, cervical region: Secondary | ICD-10-CM | POA: Diagnosis not present

## 2014-10-19 DIAGNOSIS — Z79899 Other long term (current) drug therapy: Secondary | ICD-10-CM | POA: Insufficient documentation

## 2014-10-19 DIAGNOSIS — R0789 Other chest pain: Secondary | ICD-10-CM | POA: Insufficient documentation

## 2014-10-19 DIAGNOSIS — R10813 Right lower quadrant abdominal tenderness: Secondary | ICD-10-CM | POA: Insufficient documentation

## 2014-10-19 LAB — BASIC METABOLIC PANEL
ANION GAP: 13 (ref 5–15)
BUN: 7 mg/dL (ref 6–23)
CO2: 21 mEq/L (ref 19–32)
Calcium: 8.7 mg/dL (ref 8.4–10.5)
Chloride: 103 mEq/L (ref 96–112)
Creatinine, Ser: 0.62 mg/dL (ref 0.50–1.10)
GFR calc Af Amer: >90 mL/min (ref >90)
GFR calc non Af Amer: >90 mL/min (ref >90)
Glucose, Bld: 94 mg/dL (ref 70–99)
Potassium: 3.8 mEq/L (ref 3.7–5.3)
Sodium: 137 mEq/L (ref 137–147)

## 2014-10-19 LAB — CBC
HEMATOCRIT: 39 % (ref 36.0–46.0)
HEMOGLOBIN: 13 g/dL (ref 12.0–15.0)
MCH: 28.8 pg (ref 26.0–34.0)
MCHC: 33.3 g/dL (ref 30.0–36.0)
MCV: 86.5 fL (ref 78.0–100.0)
Platelets: 304 10*3/uL (ref 150–400)
RBC: 4.51 MIL/uL (ref 3.87–5.11)
RDW: 13.2 % (ref 11.5–15.5)
WBC: 8.9 10*3/uL (ref 4.0–10.5)

## 2014-10-19 LAB — POC URINE PREG, ED: PREG TEST UR: NEGATIVE

## 2014-10-19 LAB — I-STAT TROPONIN, ED: Troponin i, poc: 0 ng/mL (ref 0.00–0.08)

## 2014-10-19 LAB — POC OCCULT BLOOD, ED: FECAL OCCULT BLD: NEGATIVE

## 2014-10-19 MED ORDER — HYDROCODONE-ACETAMINOPHEN 5-325 MG PO TABS
1.0000 | ORAL_TABLET | Freq: Once | ORAL | Status: AC
  Administered 2014-10-19: 1 via ORAL
  Filled 2014-10-19: qty 1

## 2014-10-19 MED ORDER — DIAZEPAM 5 MG PO TABS: 5.0000 mg | ORAL_TABLET | Freq: Three times a day (TID) | ORAL | Status: DC | PRN

## 2014-10-19 MED ORDER — TRAMADOL HCL 50 MG PO TABS: 50.0000 mg | ORAL_TABLET | Freq: Four times a day (QID) | ORAL | Status: DC | PRN

## 2014-10-19 MED ORDER — DIAZEPAM 5 MG PO TABS
5.0000 mg | ORAL_TABLET | Freq: Once | ORAL | Status: AC
  Administered 2014-10-19: 5 mg via ORAL
  Filled 2014-10-19: qty 1

## 2014-10-19 MED ORDER — NAPROXEN 500 MG PO TABS: 500.0000 mg | ORAL_TABLET | Freq: Two times a day (BID) | ORAL | Status: DC

## 2014-10-19 NOTE — ED Provider Notes (Signed)
CSN: [id number]     Arrival date & time 10/19/14  [id number] History   First MD Initiated Contact with Patient 10/19/14 [phone]     Chief Complaint  Patient presents with  . Chest Pain     (Consider location/radiation/quality/duration/timing/severity/associated sxs/prior Treatment) HPI Leah [name] is a 38 y.o. female with history of anxiety, no other medical problems, who presents to emergency department complaining of nausea, dizziness, upper back pain, left arm discomfort, right lower abdominal pain, black stools. Patient states black stools and right lower quadrant abdominal pain has been going on for approximately 3 weeks. She has seen her primary care doctor for this and has an ultrasound scheduled. Patient states the main reason she called EMS is her arm and back pain. Patient states yesterday afternoon she developed some nausea and dizziness. Last night when she went to work she developed upper back pain, left arm discomfort. States she wasn't sure if she was having a heart attack. Patient states pain is constant, worsened with position. She called EMS, who told her to take 4 baby aspirins. Patient was brought here for further evaluation. Patient denies any personal or family history of heart problems. She denies any chest pain. No shortness of breath. No fever, chills, malaise. No other complaints.   Past Medical History  Diagnosis Date  . Anxiety    History reviewed. No pertinent past surgical history. Family History  Problem Relation Age of Onset  . Cancer Mother   . Cancer Other    History  Substance Use Topics  . Smoking status: Current Every Day Smoker -- 1.00 packs/day    Types: Cigarettes  . Smokeless tobacco: Not on file  . Alcohol Use: Yes     Comment: social   OB History   Grav Para Term Preterm Abortions TAB SAB Ect Mult Living                 Review of Systems  Constitutional: Negative for fever and chills.  Respiratory: Positive for chest tightness. Negative for  cough and shortness of breath.   Cardiovascular: Positive for chest pain. Negative for palpitations and leg swelling.  Gastrointestinal: Positive for nausea, vomiting and abdominal pain. Negative for diarrhea.  Genitourinary: Negative for dysuria, flank pain and pelvic pain.  Musculoskeletal: Positive for arthralgias. Negative for myalgias, neck pain and neck stiffness.  Skin: Negative for rash.  Neurological: Negative for dizziness, weakness and headaches.  All other systems reviewed and are negative.     Allergies  Review of patient's allergies indicates no known allergies.  Home Medications   Prior to Admission medications   Medication Sig Start Date End Date Taking? Authorizing Provider  escitalopram (LEXAPRO) 10 MG tablet Take 10 mg by mouth daily.    Historical Provider, MD  HYDROcodone-acetaminophen (NORCO/VICODIN) 5-325 MG per tablet Take 1 tablet by mouth every 6 (six) hours as needed for moderate pain. 03/17/14   Jamesetta Orleanshristopher W Lawyer, PA-C  ibuprofen (ADVIL,MOTRIN) 800 MG tablet Take 1 tablet (800 mg total) by mouth every 8 (eight) hours as needed. 03/17/14   Carlyle Dollyhristopher W Lawyer, PA-C  levonorgestrel (MIRENA) 20 MCG/24HR IUD 1 each by Intrauterine route continuous.    Historical Provider, MD  methocarbamol (ROBAXIN-750) 750 MG tablet Take 1 tablet (750 mg total) by mouth 4 (four) times daily. 04/19/14   Toy BakerAnthony T Allen, MD  predniSONE (DELTASONE) 10 MG tablet Take 2 tablets (20 mg total) by mouth daily. 04/19/14   Toy BakerAnthony T Allen, MD   BP 513 033 [id number]/60  Pulse 64  Temp(Src) 98.2 F (36.8 C)  Resp 15  SpO2 98%  LMP 09/28/2014 Physical Exam  Nursing note and vitals reviewed. Constitutional: She is oriented to person, place, and time. She appears well-developed and well-nourished. No distress.  HENT:  Head: Normocephalic.  Eyes: Conjunctivae are normal.  Neck: Neck supple.  Cardiovascular: Normal rate, regular rhythm and normal heart sounds.   Pulmonary/Chest: Effort normal and  breath sounds normal. No respiratory distress. She has no wheezes. She has no rales.  Abdominal: Soft. Bowel sounds are normal. She exhibits no distension. There is tenderness. There is no rebound and no guarding.  Right lower abdominal tenderness. No guarding or rebound tenderness  Musculoskeletal: She exhibits no edema.  Mild lower cervical/ upper  Thoracic spine and perispinal tenderness. Full ROM of bilateral upper and lower extremities. Distal radial pulses intact and equal bilaterally.   Neurological: She is alert and oriented to person, place, and time.  Skin: Skin is warm and dry.  Psychiatric: She has a normal mood and affect. Her behavior is normal.    ED Course  Procedures (including critical care time) Labs Review Labs Reviewed  CBC  BASIC METABOLIC PANEL  PREGNANCY, URINE  I-STAT TROPOININ, ED  POC URINE PREG, ED  POC OCCULT BLOOD, ED    Imaging Review No results found.   EKG Interpretation   Date/Time:  Thursday October 19 2014 06:35:02 EDT Ventricular Rate:  64 PR Interval:  135 QRS Duration: 87 QT Interval:  437 QTC Calculation: 451 R Axis:   67 Text Interpretation:  Sinus rhythm no change from prior Confirmed by  HORTON  MD, COURTNEY ([id number]) on 10/19/2014 6:41:27 AM      MDM   Final diagnoses:  Chest pain  Cervical radiculopathy    Pt with multiple non specific complaints including CP, left arm pain, neck pain. Pt also has hx of anxiety, and currently appears very anxious. Will get labs, CXR, will try a muscle relaxant, pain medications.    All labs and x-ray negative. VS normal. PERC negative, doubt PE. Low risk for CAD with negative trop and ECG. CXR normal. Pt is having neck pain, suspect could be related to cervical radiculopathy. Pt feeling better with pain meds and valium for spasms. Will d/c home with outpatient follow up. Home with ultram, naprosyn, valium.   Filed Vitals:   10/19/14 0826 10/19/14 0845 10/19/14 0930 10/19/14 0954  BP:  96/63 102/74 93/70 106/65  Pulse: 62 64 61   Temp:      Resp: 16 16 20 19   SpO2: 99% 99% 99% 98%     Lottie Musselatyana A Ebon Ketchum, PA-C 10/19/14 1655

## 2014-10-19 NOTE — ED Provider Notes (Signed)
Medical screening examination/treatment/procedure(s) were performed by non-physician practitioner and as supervising physician I was immediately available for consultation/collaboration.   EKG Interpretation   Date/Time:  Thursday October 19 2014 06:35:02 EDT Ventricular Rate:  64 PR Interval:  135 QRS Duration: 87 QT Interval:  437 QTC Calculation: 451 R Axis:   67 Text Interpretation:  Sinus rhythm no change from prior Confirmed by  Srikar Chiang  MD, Malini Flemings ([id number]) on 10/19/2014 6:41:27 AM        Shon Batonourtney F Pearl Berlinger, MD 10/19/14 2311

## 2014-10-19 NOTE — Discharge Instructions (Signed)
Naprosyn for pain. Ultram for severe pain. Valium for spasms. Follow up with primary care doctor if not improving. Return if worsening symptoms.   Cervical Radiculopathy Cervical radiculopathy happens when a nerve in the neck is pinched or bruised by a slipped (herniated) disk or by arthritic changes in the bones of the cervical spine. This can occur due to an injury or as part of the normal aging process. Pressure on the cervical nerves can cause pain or numbness that runs from your neck all the way down into your arm and fingers. CAUSES  There are many possible causes, including:  Injury.  Muscle tightness in the neck from overuse.  Swollen, painful joints (arthritis).  Breakdown or degeneration in the bones and joints of the spine (spondylosis) due to aging.  Bone spurs that may develop near the cervical nerves. SYMPTOMS  Symptoms include pain, weakness, or numbness in the affected arm and hand. Pain can be severe or irritating. Symptoms may be worse when extending or turning the neck. DIAGNOSIS  Your caregiver will ask about your symptoms and do a physical exam. He or she may test your strength and reflexes. X-rays, CT scans, and MRI scans may be needed in cases of injury or if the symptoms do not go away after a period of time. Electromyography (EMG) or nerve conduction testing may be done to study how your nerves and muscles are working. TREATMENT  Your caregiver may recommend certain exercises to help relieve your symptoms. Cervical radiculopathy can, and often does, get better with time and treatment. If your problems continue, treatment options may include:  Wearing a soft collar for short periods of time.  Physical therapy to strengthen the neck muscles.  Medicines, such as nonsteroidal anti-inflammatory drugs (NSAIDs), oral corticosteroids, or spinal injections.  Surgery. Different types of surgery may be done depending on the cause of your problems. HOME CARE INSTRUCTIONS     Put ice on the affected area.  Put ice in a plastic bag.  Place a towel between your skin and the bag.  Leave the ice on for 15-20 minutes, 03-04 times a day or as directed by your caregiver.  If ice does not help, you can try using heat. Take a warm shower or bath, or use a hot water bottle as directed by your caregiver.  You may try a gentle neck and shoulder massage.  Use a flat pillow when you sleep.  Only take over-the-counter or prescription medicines for pain, discomfort, or fever as directed by your caregiver.  If physical therapy was prescribed, follow your caregiver's directions.  If a soft collar was prescribed, use it as directed. SEEK IMMEDIATE MEDICAL CARE IF:   Your pain gets much worse and cannot be controlled with medicines.  You have weakness or numbness in your hand, arm, face, or leg.  You have a high fever or a stiff, rigid neck.  You lose bowel or bladder control (incontinence).  You have trouble with walking, balance, or speaking. MAKE SURE YOU:   Understand these instructions.  Will watch your condition.  Will get help right away if you are not doing well or get worse. Document Released: 09/02/2001 Document Revised: 03/01/2012 Document Reviewed: 07/22/2011 [facility] Patient Information 2015 [place], [place]. This information is not intended to replace advice given to you by your health care provider. Make sure you discuss any questions you have with your health care provider.

## 2014-10-19 NOTE — ED Notes (Signed)
PER EMS: pt from home reports sudden onset of dull pain to left arm that radiates to left shoulder that started last night at 10 pm while at work. Pt also denies CP right now but reports having chest pain intermittently for the past few days. Denies SOB. VS BP-114/70, HR-90, RR-18, A&OX4, NSR.

## 2015-12-10 ENCOUNTER — Encounter (HOSPITAL_COMMUNITY): Payer: Self-pay | Admitting: Nurse Practitioner

## 2015-12-10 ENCOUNTER — Emergency Department (HOSPITAL_COMMUNITY)
Admission: EM | Admit: 2015-12-10 | Discharge: 2015-12-11 | Disposition: A | Discharge status: Home / Self Care | Payer: Medicaid Other | Attending: Emergency Medicine | Admitting: Emergency Medicine

## 2015-12-10 ENCOUNTER — Emergency Department (HOSPITAL_COMMUNITY): Payer: Medicaid Other

## 2015-12-10 DIAGNOSIS — Z791 Long term (current) use of non-steroidal anti-inflammatories (NSAID): Secondary | ICD-10-CM | POA: Insufficient documentation

## 2015-12-10 DIAGNOSIS — Z7982 Long term (current) use of aspirin: Secondary | ICD-10-CM | POA: Insufficient documentation

## 2015-12-10 DIAGNOSIS — F419 Anxiety disorder, unspecified: Secondary | ICD-10-CM | POA: Insufficient documentation

## 2015-12-10 DIAGNOSIS — Z3202 Encounter for pregnancy test, result negative: Secondary | ICD-10-CM | POA: Insufficient documentation

## 2015-12-10 DIAGNOSIS — J069 Acute upper respiratory infection, unspecified: Secondary | ICD-10-CM | POA: Insufficient documentation

## 2015-12-10 DIAGNOSIS — Z79899 Other long term (current) drug therapy: Secondary | ICD-10-CM | POA: Insufficient documentation

## 2015-12-10 DIAGNOSIS — R0789 Other chest pain: Secondary | ICD-10-CM | POA: Insufficient documentation

## 2015-12-10 DIAGNOSIS — F17210 Nicotine dependence, cigarettes, uncomplicated: Secondary | ICD-10-CM | POA: Insufficient documentation

## 2015-12-10 NOTE — ED Notes (Signed)
Patient presents to ED today with complaints of a 'pulling' like feeling in her right chest area and she points to the area of her right breast. She states that this feeling was strong enough to cause her to come to the ED and since her arrival she has had shooting pain down her right side. She has also noticed cramping in her left hand and foot that started a week ago. 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 Denies shortness of breath.

## 2015-12-10 NOTE — ED Provider Notes (Signed)
CSN: [id number]     Arrival date & time 12/10/15  2233 History  By signing my name below, I, Arianna Nassar, attest that this documentation has been prepared under the direction and in the presence of Gilda Creasehristopher J Pollina, MD. Electronically Signed: Octavia HeirArianna Nassar, ED Scribe. 12/11/2015. 12:16 AM.    Chief Complaint  Patient presents with  . Muscle Pain    right chest- pulling     The history is provided by the patient. No language interpreter was used.   HPI Comments: Marylee FlorasKeisha L Gibb is a 39 y.o. female who presents to the Emergency Department complaining of acute onset, intermittent "pulling in chest" with associated coughing about two hours ago. Pt reports the feeling lasted for a few seconds and then went away. She notes she has not had this feeling before. Pt states no exertion made her pain better or worse. Pt denies injury to the area and shortness of breath.   Past Medical History  Diagnosis Date  . Anxiety    History reviewed. No pertinent past surgical history. Family History  Problem Relation Age of Onset  . Cancer Mother   . Cancer Other    Social History  Substance Use Topics  . Smoking status: Current Every Day Smoker -- 1.00 packs/day    Types: Cigarettes  . Smokeless tobacco: None  . Alcohol Use: Yes     Comment: social   OB History    Gravida Para Term Preterm AB TAB SAB Ectopic Multiple Living   5 3 0  2          Review of Systems  Respiratory: Positive for cough and chest tightness. Negative for shortness of breath.   All other systems reviewed and are negative.     Allergies  Review of patient's allergies indicates no known allergies.  Home Medications   Prior to Admission medications   Medication Sig Start Date End Date Taking? Authorizing Provider  aspirin 81 MG chewable tablet Chew 81 mg by mouth once.   Yes Historical Provider, MD  diazepam (VALIUM) 5 MG tablet Take 1 tablet (5 mg total) by mouth every 8 (eight) hours as needed for  anxiety. 10/19/14   Tatyana Kirichenko, PA-C  escitalopram (LEXAPRO) 10 MG tablet Take 10 mg by mouth daily.    Historical Provider, MD  levonorgestrel (MIRENA) 20 MCG/24HR IUD 1 each by Intrauterine route continuous.    Historical Provider, MD  naproxen (NAPROSYN) 500 MG tablet Take 1 tablet (500 mg total) by mouth 2 (two) times daily. 10/19/14   Tatyana Kirichenko, PA-C  sulfamethoxazole-trimethoprim (BACTRIM DS) 800-160 MG per tablet Take 1 tablet by mouth 2 (two) times daily.    Historical Provider, MD  traMADol (ULTRAM) 50 MG tablet Take 1 tablet (50 mg total) by mouth every 6 (six) hours as needed. 10/19/14   Jaynie Crumbleatyana Kirichenko, PA-C   Triage vitals: BP 121/78 mmHg  Pulse 96  Temp(Src) 98.6 F (37 C) (Oral)  Resp 18  SpO2 97%  LMP  (LMP Unknown) Physical Exam  Constitutional: She is oriented to person, place, and time. She appears well-developed and well-nourished. No distress.  HENT:  Head: Normocephalic and atraumatic.  Right Ear: Hearing normal.  Left Ear: Hearing normal.  Nose: Nose normal.  Mouth/Throat: Oropharynx is clear and moist and mucous membranes are normal.  Eyes: Conjunctivae and EOM are normal. Pupils are equal, round, and reactive to light.  Neck: Normal range of motion. Neck supple.  Cardiovascular: Regular rhythm, S1 normal and S2 normal.  Exam reveals no gallop and no friction rub.   No murmur heard. Pulmonary/Chest: Effort normal and breath sounds normal. No respiratory distress. She exhibits no tenderness.  Abdominal: Soft. Normal appearance and bowel sounds are normal. There is no hepatosplenomegaly. There is no tenderness. There is no rebound, no guarding, no tenderness at McBurney's point and negative Murphy's sign. No hernia.  Musculoskeletal: Normal range of motion.  Neurological: She is alert and oriented to person, place, and time. She has normal strength. No cranial nerve deficit or sensory deficit. Coordination normal. GCS eye subscore is 4. GCS  verbal subscore is 5. GCS motor subscore is 6.  Skin: Skin is warm, dry and intact. No rash noted. No cyanosis.  Psychiatric: She has a normal mood and affect. Her speech is normal and behavior is normal. Thought content normal.  Nursing note and vitals reviewed.   ED Course  Procedures  DIAGNOSTIC STUDIES: Oxygen Saturation is 97% on RA, normal by my interpretation.  COORDINATION OF CARE:  11:57 PM Discussed treatment plan with pt at bedside and pt agreed to plan.  Labs Reviewed  BASIC METABOLIC PANEL - Abnormal; Notable for the following:    Calcium 8.7 (*)    All other components within normal limits  CBC  I-STAT TROPOININ, ED  I-STAT BETA HCG BLOOD, ED ([facility], [facility], [facility] ONLY)    Imaging Review Dg Chest 2 View  12/11/2015  CLINICAL DATA:  Anterior pulling chest pain, slight cough and chills, worsening today. EXAM: CHEST  2 VIEW COMPARISON:  Chest radiograph October 19, 2014 FINDINGS: Cardiomediastinal silhouette is normal. Breast attenuation. The lungs are clear without pleural effusions or focal consolidations. Trachea projects midline and there is no pneumothorax. Soft tissue planes and included osseous structures are non-suspicious. IMPRESSION: Normal chest. Electronically Signed   By: Awilda Metro M.D.   On: 12/11/2015 00:33   I have personally reviewed and evaluated these images and lab results as part of my medical decision-making.   EKG Interpretation   Date/Time:  Tuesday December 11 2015 00:16:42 EST Ventricular Rate:  81 PR Interval:  134 QRS Duration: 89 QT Interval:  407 QTC Calculation: 472 R Axis:   49 Text Interpretation:  Sinus rhythm Baseline wander in lead(s) V2 Normal  ECG Confirmed by POLLINA  MD, CHRISTOPHER ([id number]) on 12/11/2015 12:22:46  AM      MDM   Final diagnoses:  None  URI Chest pain  Page presents to the bridge program with complaints of chest pain. Patient reports that she had onset of a pulling sensation across her chest earlier  today. It lasted for a few minutes and then resolved. Patient does not have any significant cardiac risk factors. EKG is normal. Troponin was negative. She has not had any recurrence of the pain. Pain is extremely atypical and she is very low likelihood for cardiac etiology. She has had cough and congestion for several days, chest x-ray does not show pneumonia. She is not hypoxic. No concern for PE. Patient reassured.  I personally performed the services described in this documentation, which was scribed in my presence. The recorded information has been reviewed and is accurate.    Gilda Crease, MD 12/11/15 [phone]

## 2015-12-10 NOTE — ED Notes (Signed)
Went in to assess the patient and the patient is sound asleep. Wake her up to assess her and the patient states that she was having muscle pulling in her chest and then generalized. When asked where she states that right side. The patient getting changed into a gown at this time.

## 2015-12-11 LAB — I-STAT TROPONIN, ED: Troponin i, poc: 0 ng/mL (ref 0.00–0.08)

## 2015-12-11 LAB — BASIC METABOLIC PANEL
ANION GAP: 7 (ref 5–15)
BUN: 12 mg/dL (ref 6–20)
CO2: 26 mmol/L (ref 22–32)
Calcium: 8.7 mg/dL — ABNORMAL LOW (ref 8.9–10.3)
Chloride: 105 mmol/L (ref 101–111)
Creatinine, Ser: 0.74 mg/dL (ref 0.44–1.00)
GFR calc Af Amer: >60 mL/min (ref >60)
GLUCOSE: 93 mg/dL (ref 65–99)
POTASSIUM: 4 mmol/L (ref 3.5–5.1)
Sodium: 138 mmol/L (ref 135–145)

## 2015-12-11 LAB — I-STAT BETA HCG BLOOD, ED (MC, WL, AP ONLY): I-stat hCG, quantitative: <5 m[IU]/mL (ref <5)

## 2015-12-11 LAB — CBC
HEMATOCRIT: 41 % (ref 36.0–46.0)
Hemoglobin: 13.3 g/dL (ref 12.0–15.0)
MCH: 28.5 pg (ref 26.0–34.0)
MCHC: 32.4 g/dL (ref 30.0–36.0)
MCV: 87.8 fL (ref 78.0–100.0)
Platelets: 241 10*3/uL (ref 150–400)
RBC: 4.67 MIL/uL (ref 3.87–5.11)
RDW: 13.5 % (ref 11.5–15.5)
WBC: 8.3 10*3/uL (ref 4.0–10.5)

## 2015-12-11 NOTE — Discharge Instructions (Signed)
Chest Wall Pain °Chest wall pain is pain in or around the bones and muscles of your chest. Sometimes, an injury causes this pain. Sometimes, the cause may not be known. This pain may take several weeks or longer to get better. °HOME CARE INSTRUCTIONS  °Pay attention to any changes in your symptoms. Take these actions to help with your pain:  °· Rest as told by your health care provider.   °· Avoid activities that cause pain. These include any activities that use your chest muscles or your abdominal and side muscles to lift heavy items.    °· If directed, apply ice to the painful area: °· Put ice in a plastic bag. °· Place a towel between your skin and the bag. °· Leave the ice on for 20 minutes, 2-3 times per day. °· Take over-the-counter and prescription medicines only as told by your health care provider. °· Do not use tobacco products, including cigarettes, chewing tobacco, and e-cigarettes. If you need help quitting, ask your health care provider. °· Keep all follow-up visits as told by your health care provider. This is important. °SEEK MEDICAL CARE IF: °· You have a fever. °· Your chest pain becomes worse. °· You have new symptoms. °SEEK IMMEDIATE MEDICAL CARE IF: °· You have nausea or vomiting. °· You feel sweaty or light-headed. °· You have a cough with phlegm (sputum) or you cough up blood. °· You develop shortness of breath. °  °This information is not intended to replace advice given to you by your health care provider. Make sure you discuss any questions you have with your health care provider. °  °Document Released: 12/08/2005 Document Revised: 08/29/2015 Document Reviewed: 03/05/2015 °Elsevier Interactive Patient Education ©2016 [employer] °Viral Infections °A virus is a type of germ. Viruses can cause: °· Minor sore throats. °· Aches and pains. °· Headaches. °· Runny nose. °· Rashes. °· Watery eyes. °· Tiredness. °· Coughs. °· Loss of appetite. °· Feeling sick to your stomach (nausea). °· Throwing  up (vomiting). °· Watery poop (diarrhea). °HOME CARE  °· Only take medicines as told by your doctor. °· Drink enough water and fluids to keep your pee (urine) clear or pale yellow. Sports drinks are a good choice. °· Get plenty of rest and eat healthy. Soups and broths with crackers or rice are fine. °GET HELP RIGHT AWAY IF:  °· You have a very bad headache. °· You have shortness of breath. °· You have chest pain or neck pain. °· You have an unusual rash. °· You cannot stop throwing up. °· You have watery poop that does not stop. °· You cannot keep fluids down. °· You or your child has a temperature by mouth above 102° F (38.9° C), not controlled by medicine. °· Your baby is older than 3 months with a rectal temperature of 102° F (38.9° C) or higher. °· Your baby is 3 months old or younger with a rectal temperature of 100.4° F (38° C) or higher. °MAKE SURE YOU:  °· Understand these instructions. °· Will watch this condition. °· Will get help right away if you are not doing well or get worse. °  °This information is not intended to replace advice given to you by your health care provider. Make sure you discuss any questions you have with your health care provider. °  °Document Released: 11/20/2008 Document Revised: 03/01/2012 Document Reviewed: 05/16/2015 °Elsevier Interactive Patient Education ©2016 [employer] Inc. ° °

## 2017-06-06 ENCOUNTER — Emergency Department (HOSPITAL_COMMUNITY): Payer: Medicaid Other

## 2017-06-06 ENCOUNTER — Emergency Department (HOSPITAL_COMMUNITY)
Admission: EM | Admit: 2017-06-06 | Discharge: 2017-06-06 | Disposition: A | Discharge status: Home / Self Care | Payer: Medicaid Other | Attending: Emergency Medicine | Admitting: Emergency Medicine

## 2017-06-06 ENCOUNTER — Encounter (HOSPITAL_COMMUNITY): Payer: Self-pay | Admitting: Emergency Medicine

## 2017-06-06 DIAGNOSIS — M5416 Radiculopathy, lumbar region: Secondary | ICD-10-CM | POA: Insufficient documentation

## 2017-06-06 DIAGNOSIS — Z79899 Other long term (current) drug therapy: Secondary | ICD-10-CM | POA: Insufficient documentation

## 2017-06-06 DIAGNOSIS — F17210 Nicotine dependence, cigarettes, uncomplicated: Secondary | ICD-10-CM | POA: Insufficient documentation

## 2017-06-06 DIAGNOSIS — M5417 Radiculopathy, lumbosacral region: Secondary | ICD-10-CM

## 2017-06-06 MED ORDER — TRAMADOL HCL 50 MG PO TABS: 50.0000 mg | ORAL_TABLET | Freq: Four times a day (QID) | ORAL | 0 refills | Status: DC | PRN

## 2017-06-06 MED ORDER — KETOROLAC TROMETHAMINE 30 MG/ML IJ SOLN
30.0000 mg | Freq: Once | INTRAMUSCULAR | Status: AC
  Administered 2017-06-06: 30 mg via INTRAMUSCULAR
  Filled 2017-06-06: qty 1

## 2017-06-06 MED ORDER — METHOCARBAMOL 500 MG PO TABS
750.0000 mg | ORAL_TABLET | Freq: Once | ORAL | Status: AC
  Administered 2017-06-06: 750 mg via ORAL
  Filled 2017-06-06: qty 2

## 2017-06-06 MED ORDER — METHOCARBAMOL 500 MG PO TABS: 500.0000 mg | ORAL_TABLET | Freq: Two times a day (BID) | ORAL | 0 refills | Status: DC

## 2017-06-06 MED ORDER — OXYCODONE-ACETAMINOPHEN 5-325 MG PO TABS
1.0000 | ORAL_TABLET | Freq: Once | ORAL | Status: AC
Start: 2017-06-06 — End: 2017-06-06
  Administered 2017-06-06: 1 via ORAL
  Filled 2017-06-06: qty 1

## 2017-06-06 MED ORDER — PREDNISONE 20 MG PO TABS
40.0000 mg | ORAL_TABLET | Freq: Every day | ORAL | 0 refills | Status: DC
Start: 2017-06-06 — End: 2018-05-18

## 2017-06-06 NOTE — Discharge Instructions (Signed)
Take prednisone as prescribed until all gone. Ibuprofen after finish prednisone. Take tramadol for pain as prescribed as needed. Robaxin for muscle spasms. Follow up with family doctor.

## 2017-06-06 NOTE — ED Notes (Signed)
Patient transported to X-ray 

## 2017-06-06 NOTE — ED Notes (Signed)
Patient ambulate in the hall with no problem. Pt state she is feeling better went she is walking.

## 2017-06-06 NOTE — ED Provider Notes (Signed)
[facility]-EMERGENCY DEPT Provider Note   CSN: [id number] Arrival date & time: 06/06/17  0524     History   Chief Complaint Chief Complaint  Patient presents with  . Hip Pain    HPI SYMPHONI HELBLING is a 41 y.o. female.  HPI LATECIA MILER is a 41 y.o. female presents to emergency department complaining of back pain and right foot numbness. Patient states her pain started yesterday morning. She states she was able to work last night, works night shift, and she had to stand a lot. States this morning she was driving home from work she noticed that her right foot was going numb intermittently. She states pain in her lower back is sharp, radiates from her lower back on the right side into the right hip. She states pain is worsened with sitting and changing positions. Reports right foot numbness and weakness. Denies history of the same. Denies any recent injuries. States she went to know what her [facility] emergency department  Past Medical History:  Diagnosis Date  . Anxiety     There are no active problems to display for this patient.   History reviewed. No pertinent surgical history.  OB History    Gravida Para Term Preterm AB Living   5 3 0   2     SAB TAB Ectopic Multiple Live Births                   Home Medications    Prior to Admission medications   Medication Sig Start Date End Date Taking? Authorizing Provider  aspirin 81 MG chewable tablet Chew 81 mg by mouth once.    [provider]  diazepam (VALIUM) 5 MG tablet Take 1 tablet (5 mg total) by mouth every 8 (eight) hours as needed for anxiety. 10/19/14   Jazzmen Restivo, PA-C  escitalopram (LEXAPRO) 10 MG tablet Take 10 mg by mouth daily.    [provider]  levonorgestrel (MIRENA) 20 MCG/24HR IUD 1 each by Intrauterine route continuous.    [provider]  naproxen (NAPROSYN) 500 MG tablet Take 1 tablet (500 mg total) by mouth 2 (two) times daily. 10/19/14   Cherrise Occhipinti, Lemont Fillers, PA-C    sulfamethoxazole-trimethoprim (BACTRIM DS) 800-160 MG per tablet Take 1 tablet by mouth 2 (two) times daily.    [provider]  traMADol (ULTRAM) 50 MG tablet Take 1 tablet (50 mg total) by mouth every 6 (six) hours as needed. 10/19/14   Jaynie Crumble, PA-C    Family History Family History  Problem Relation Age of Onset  . Cancer Mother   . Cancer Other     Social History Social History  Substance Use Topics  . Smoking status: Current Every Day Smoker    Packs/day: 1.00    Types: Cigarettes  . Smokeless tobacco: Never Used  . Alcohol use Yes     Comment: social     Allergies   Patient has no known allergies.   Review of Systems Review of Systems  Constitutional: Negative for chills and fever.  Respiratory: Negative for cough, chest tightness and shortness of breath.   Cardiovascular: Negative for chest pain, palpitations and leg swelling.  Gastrointestinal: Negative for abdominal pain, diarrhea, nausea and vomiting.  Genitourinary: Negative for dysuria, flank pain, pelvic pain, vaginal bleeding, vaginal discharge and vaginal pain.  Musculoskeletal: Positive for arthralgias, back pain and myalgias. Negative for neck pain and neck stiffness.  Skin: Negative for rash.  Neurological: Negative for dizziness, weakness and headaches.  All other systems reviewed and are negative.    Physical Exam Updated Vital Signs BP 131/83 (BP Location: Right Arm)   Pulse 89   Temp 97.8 F (36.6 C) (Oral)   Resp 16   Ht 5\' 5"  (1.651 m)   Wt 82.6 kg (182 lb)   SpO2 100%   BMI 30.29 kg/m   Physical Exam  Constitutional: She appears well-developed and well-nourished. No distress.  HENT:  Head: Normocephalic.  Eyes: Conjunctivae are normal.  Neck: Neck supple.  Cardiovascular: Normal rate, regular rhythm and normal heart sounds.   Pulmonary/Chest: Effort normal and breath sounds normal. No respiratory distress. She has no wheezes. She has no rales.  Abdominal:  Soft. Bowel sounds are normal. She exhibits no distension. There is no tenderness. There is no rebound.  Musculoskeletal: She exhibits no edema.  ttp over lumbar spine and right buttock. Pain with right straight leg riase. DP pulses intact and equal bilaterally  Neurological: She is alert.  Decreased sensation to dorsal right foot and great toe. Decreased strength with dorsiflexion and plantarflexion of right foot. Patellar reflexes 2+ and equal bilaterally  Skin: Skin is warm and dry.  Psychiatric: She has a normal mood and affect. Her behavior is normal.  Nursing note and vitals reviewed.    ED Treatments / Results  Labs (all labs ordered are listed, but only abnormal results are displayed) Labs Reviewed - No data to display  EKG  EKG Interpretation None       Radiology Dg Lumbar Spine Complete  Result Date: 06/06/2017 CLINICAL DATA:  Low back pain. EXAM: LUMBAR SPINE - COMPLETE 4+ VIEW COMPARISON:  None FINDINGS: There is no evidence of lumbar spine fracture. Alignment is normal. Intervertebral disc spaces are maintained. IMPRESSION: Negative. Electronically Signed   By: Signa Kellaylor  Stroud M.D.   On: 06/06/2017 07:33   Dg Hip Unilat  With Pelvis 2-3 Views Right  Result Date: 06/06/2017 CLINICAL DATA:  Right hip pain EXAM: DG HIP (WITH OR WITHOUT PELVIS) 2-3V RIGHT COMPARISON:  None. FINDINGS: There is no evidence of hip fracture or dislocation. There is no evidence of arthropathy or other focal bone abnormality. IMPRESSION: Normal right hip. Electronically Signed   By: Deatra RobinsonKevin  Herman M.D.   On: 06/06/2017 06:16    Procedures Procedures (including critical care time)  Medications Ordered in ED Medications  oxyCODONE-acetaminophen (PERCOCET/ROXICET) 5-325 MG per tablet 1 tablet (not administered)  ketorolac (TORADOL) 30 MG/ML injection 30 mg (not administered)  methocarbamol (ROBAXIN) tablet 750 mg (not administered)     Initial Impression / Assessment and Plan / ED Course  I  have reviewed the triage vital signs and the nursing notes.  Pertinent labs & imaging results that were available during my care of the patient were reviewed by me and considered in my medical decision making (see chart for details).     Patient with right-sided back pain radiating to the right leg, with some numbness to the foot. Initially with weakness of the foot on exam and decreased sensation to the dorsal foot. Patient's pain improved with Percocet, Robaxin, Toradol. She is able to ambulate in the hallway with no obvious weakness of the foot. Gait dysfunction. I reexamined her, sensation is now intact, and she has full strength with dorsiflexion and plantar flexion of the right foot and great toe. I don't think patient needs any further imaging on emergent basis, advised her to call her PCP in follow-up as an outpatient for further evaluation. Will start on prednisone  for radicular pain, tramadol, robaxin, follow up with pcp. Return precuations prescribed  Vitals:   06/06/17 0533 06/06/17 0542 06/06/17 0820  BP: 131/83  107/76  Pulse: 89  74  Resp: 16  16  Temp: 97.8 F (36.6 C)    TempSrc: Oral    SpO2: 100%  95%  Weight:  82.6 kg (182 lb)   Height:  5\' 5"  (1.651 m)      Final Clinical Impressions(s) / ED Diagnoses   Final diagnoses:  Lumbosacral radiculopathy    New Prescriptions New Prescriptions   METHOCARBAMOL (ROBAXIN) 500 MG TABLET    Take 1 tablet (500 mg total) by mouth 2 (two) times daily.   PREDNISONE (DELTASONE) 20 MG TABLET    Take 2 tablets (40 mg total) by mouth daily.   TRAMADOL (ULTRAM) 50 MG TABLET    Take 1 tablet (50 mg total) by mouth every 6 (six) hours as needed.     Jaynie Crumble, PA-C 06/06/17 0847    Melene Plan, DO 06/06/17 2307

## 2017-06-06 NOTE — ED Triage Notes (Signed)
Patient complaining of right hip pain that started in her lower back when she got up this morning. Patient states it is going down her leg but she says her toes are numb.

## 2018-05-17 ENCOUNTER — Encounter (HOSPITAL_COMMUNITY): Payer: Self-pay | Admitting: Obstetrics and Gynecology

## 2018-05-17 ENCOUNTER — Other Ambulatory Visit: Payer: Self-pay

## 2018-05-17 DIAGNOSIS — F17210 Nicotine dependence, cigarettes, uncomplicated: Secondary | ICD-10-CM | POA: Insufficient documentation

## 2018-05-17 DIAGNOSIS — G500 Trigeminal neuralgia: Secondary | ICD-10-CM | POA: Insufficient documentation

## 2018-05-17 DIAGNOSIS — Z79899 Other long term (current) drug therapy: Secondary | ICD-10-CM | POA: Insufficient documentation

## 2018-05-17 LAB — GROUP A STREP BY PCR: Group A Strep by PCR: NOT DETECTED

## 2018-05-17 NOTE — ED Triage Notes (Signed)
Pt reports she went to her PCP and was dx with an ear infection. Pt then went to an ER and was told there was no ear infection, that is was something else. Pt was put on amoxicillin and given a steroid, pt has been taking the medications without relief since last Thursday.

## 2018-05-18 ENCOUNTER — Emergency Department (HOSPITAL_COMMUNITY): Payer: Self-pay

## 2018-05-18 ENCOUNTER — Emergency Department (HOSPITAL_COMMUNITY)
Admission: EM | Admit: 2018-05-18 | Discharge: 2018-05-18 | Disposition: A | Discharge status: Home / Self Care | Payer: Self-pay | Attending: Emergency Medicine | Admitting: Emergency Medicine

## 2018-05-18 DIAGNOSIS — G500 Trigeminal neuralgia: Secondary | ICD-10-CM

## 2018-05-18 LAB — BASIC METABOLIC PANEL
ANION GAP: 9 (ref 5–15)
BUN: 11 mg/dL (ref 6–20)
CALCIUM: 9.2 mg/dL (ref 8.9–10.3)
CO2: 25 mmol/L (ref 22–32)
Chloride: 106 mmol/L (ref 101–111)
Creatinine, Ser: 0.57 mg/dL (ref 0.44–1.00)
GFR calc Af Amer: >60 mL/min (ref >60)
GLUCOSE: 94 mg/dL (ref 65–99)
Potassium: 4.1 mmol/L (ref 3.5–5.1)
Sodium: 140 mmol/L (ref 135–145)

## 2018-05-18 LAB — CBC WITH DIFFERENTIAL/PLATELET
BASOS ABS: 0.1 10*3/uL (ref 0.0–0.1)
Basophils Relative: 1 %
EOS ABS: 0.3 10*3/uL (ref 0.0–0.7)
EOS PCT: 3 %
HCT: 43 % (ref 36.0–46.0)
Hemoglobin: 14.5 g/dL (ref 12.0–15.0)
LYMPHS PCT: 38 %
Lymphs Abs: 3.6 10*3/uL (ref 0.7–4.0)
MCH: 29.2 pg (ref 26.0–34.0)
MCHC: 33.7 g/dL (ref 30.0–36.0)
MCV: 86.7 fL (ref 78.0–100.0)
MONO ABS: 0.5 10*3/uL (ref 0.1–1.0)
Monocytes Relative: 6 %
Neutro Abs: 5 10*3/uL (ref 1.7–7.7)
Neutrophils Relative %: 52 %
PLATELETS: 320 10*3/uL (ref 150–400)
RBC: 4.96 MIL/uL (ref 3.87–5.11)
RDW: 13.5 % (ref 11.5–15.5)
WBC: 9.5 10*3/uL (ref 4.0–10.5)

## 2018-05-18 MED ORDER — MORPHINE SULFATE (PF) 4 MG/ML IV SOLN
4.0000 mg | Freq: Once | INTRAVENOUS | Status: AC
  Administered 2018-05-18: 4 mg via INTRAVENOUS
  Filled 2018-05-18: qty 1

## 2018-05-18 MED ORDER — GABAPENTIN 300 MG PO CAPS
600.0000 mg | ORAL_CAPSULE | Freq: Once | ORAL | Status: AC
  Administered 2018-05-18: 600 mg via ORAL
  Filled 2018-05-18: qty 2

## 2018-05-18 MED ORDER — IOHEXOL 300 MG/ML  SOLN
75.0000 mL | Freq: Once | INTRAMUSCULAR | Status: AC | PRN
  Administered 2018-05-18: 75 mL via INTRAVENOUS

## 2018-05-18 MED ORDER — GABAPENTIN 100 MG PO CAPS: 100.0000 mg | ORAL_CAPSULE | Freq: Three times a day (TID) | ORAL | 0 refills | Status: AC

## 2018-05-18 NOTE — ED Provider Notes (Signed)
[facility]-EMERGENCY DEPT Provider Note   CSN: [id number] Arrival date & time: 05/17/18  2227     History   Chief Complaint Chief Complaint  Patient presents with  . Otalgia  . Headache    HPI Leah [name] is a 42 y.o. female with a hx of anxiety presents to the Emergency Department complaining of gradual, persistent, progressively worsening right-sided ear and facial pain onset approximately 1 month ago.  Patient reports the pain has been steadily worsening.  6 days ago she was evaluated at an urgent care and given eardrops for " an ear infection."  She reports that 4 days ago she was evaluated at an emergency department as pain was worsening and she was diagnosed with eustachian tube dysfunction.  She reports she was given a nasal spray without relief.  Patient reports that touching her face, lying on that side, opening her mouth all worsen her pain.  She denies focal pain along the temporal region or vision changes.  Nothing seems to make her symptoms better.  Everything seems to make her symptoms worse.  Patient denies a history of herpes or shingles.  She denies open lesions on her face or scalp.  Patient denies fever, chills, neck pain, neck stiffness, chest pain, shortness of breath, abdominal pain, nausea, vomiting, diarrhea, weakness, dizziness, syncope.   The history is provided by the patient and medical records. No language interpreter was used.    Past Medical History:  Diagnosis Date  . Anxiety     There are no active problems to display for this patient.   History reviewed. No pertinent surgical history.   OB History    Gravida  5   Para  3   Term  0   Preterm      AB  2   Living        SAB      TAB      Ectopic      Multiple      Live Births               Home Medications    Prior to Admission medications   Medication Sig Start Date End Date Taking? Authorizing Provider  cetirizine (ZYRTEC) 10 MG tablet Take  10 mg by mouth daily.   Yes [provider]  gabapentin (NEURONTIN) 100 MG capsule Take 1 capsule (100 mg total) by mouth 3 (three) times daily. 05/18/18   Isaiyah Feldhaus, Dahlia Client, PA-C    Family History Family History  Problem Relation Age of Onset  . Cancer Mother   . Cancer Other     Social History Social History   Tobacco Use  . Smoking status: Current Every Day Smoker    Packs/day: 1.00    Types: Cigarettes  . Smokeless tobacco: Never Used  Substance Use Topics  . Alcohol use: Yes    Comment: social  . Drug use: No     Allergies   Patient has no known allergies.   Review of Systems Review of Systems  Constitutional: Negative for appetite change, diaphoresis, fatigue, fever and unexpected weight change.  HENT: Positive for ear pain. Negative for mouth sores.   Eyes: Negative for visual disturbance.  Respiratory: Negative for cough, chest tightness, shortness of breath and wheezing.   Cardiovascular: Negative for chest pain.  Gastrointestinal: Negative for abdominal pain, constipation, diarrhea, nausea and vomiting.  Endocrine: Negative for polydipsia, polyphagia and polyuria.  Genitourinary: Negative for dysuria, frequency, hematuria and urgency.  Musculoskeletal:  Negative for back pain and neck stiffness.  Skin: Negative for rash.  Allergic/Immunologic: Negative for immunocompromised state.  Neurological: Positive for headaches. Negative for syncope and light-headedness.  Hematological: Does not bruise/bleed easily.  Psychiatric/Behavioral: Negative for sleep disturbance. The patient is not nervous/anxious.      Physical Exam Updated Vital Signs BP 113/65   Pulse 61   Temp 98.4 F (36.9 C) (Oral)   Resp 16   Ht  (1.651 m)   Wt 92.1 kg (203 lb)   SpO2 98%   BMI 33.78 kg/m   Physical Exam  Constitutional: She is oriented to person, place, and time. She appears well-developed and well-nourished. No distress.  HENT:  Head: Normocephalic and  atraumatic.  Right Ear: Hearing, tympanic membrane, external ear and ear canal normal.  Left Ear: Hearing, tympanic membrane, external ear and ear canal normal.  Nose: No mucosal edema or rhinorrhea.  Mouth/Throat: Oropharynx is clear and moist. Mucous membranes are not dry. No trismus in the jaw. No oropharyngeal exudate, posterior oropharyngeal edema, posterior oropharyngeal erythema or tonsillar abscesses.  Eyes: Pupils are equal, round, and reactive to light. Conjunctivae and EOM are normal. No scleral icterus.  No horizontal, vertical or rotational nystagmus  Neck: Normal range of motion. Neck supple.  Full active and passive ROM without pain No midline or paraspinal tenderness No nuchal rigidity or meningeal signs  Cardiovascular: Normal rate, regular rhythm and intact distal pulses.  Pulmonary/Chest: Effort normal and breath sounds normal. No respiratory distress. She has no wheezes. She has no rales.  Abdominal: Soft. Bowel sounds are normal. There is no tenderness. There is no rebound and no guarding.  Musculoskeletal: Normal range of motion.  Lymphadenopathy:       Head (right side): No submental, no submandibular, no tonsillar, no preauricular, no posterior auricular and no occipital adenopathy present.       Head (left side): No submandibular, no tonsillar, no preauricular, no posterior auricular and no occipital adenopathy present.    She has no cervical adenopathy.  Neurological: She is alert and oriented to person, place, and time. No cranial nerve deficit. She exhibits normal muscle tone. Coordination normal.  Mental Status:  Alert, oriented, thought content appropriate. Speech fluent without evidence of aphasia. Able to follow 2 step commands without difficulty.  Cranial Nerves:  II:  Peripheral visual fields grossly normal, pupils equal, round, reactive to light III,IV, VI: ptosis not present, extra-ocular motions intact bilaterally  V,VII: smile symmetric, facial light  touch sensation equal VIII: hearing grossly normal bilaterally  IX,X: midline uvula rise  XI: bilateral shoulder shrug equal and strong XII: midline tongue extension  Motor:  5/5 in upper and lower extremities bilaterally including strong and equal grip strength and dorsiflexion/plantar flexion Sensory: Pinprick and light touch normal in all extremities.  Cerebellar: normal finger-to-nose with bilateral upper extremities Gait: normal gait and balance CV: distal pulses palpable throughout   Skin: Skin is warm and dry. No rash noted. She is not diaphoretic.  Psychiatric: She has a normal mood and affect. Her behavior is normal. Judgment and thought content normal.  Nursing note and vitals reviewed.    ED Treatments / Results  Labs (all labs ordered are listed, but only abnormal results are displayed) Labs Reviewed  GROUP A STREP BY PCR  CBC WITH DIFFERENTIAL/PLATELET  BASIC METABOLIC PANEL     Radiology Ct Maxillofacial W Contrast  Result Date: 05/18/2018 CLINICAL DATA:  Right temporomandibular joint and ear pain. EXAM: CT MAXILLOFACIAL WITH CONTRAST  TECHNIQUE: Multidetector CT imaging of the maxillofacial structures was performed with intravenous contrast. Multiplanar CT image reconstructions were also generated. CONTRAST:  75mL OMNIPAQUE IOHEXOL 300 MG/ML  SOLN COMPARISON:  None. FINDINGS: Osseous: --Fractures: None. --Mandible, hard palate and teeth: No acute abnormality. Orbits: The globes are intact. Normal appearance of the intra- and extraconal fat. Symmetric extraocular muscles. Sinuses: No fluid levels or advanced mucosal thickening. Soft tissues: Normal visualized extracranial soft tissues. Limited intracranial: Normal. IMPRESSION: Normal maxillofacial CT. Specifically, normal appearance of the right temporomandibular joint and middle ear. Electronically Signed   By: Deatra Robinson M.D.   On: 05/18/2018 04:34    Procedures Procedures (including critical care  time)  Medications Ordered in ED Medications  morphine 4 MG/ML injection 4 mg (4 mg Intravenous Given 05/18/18 0255)  iohexol (OMNIPAQUE) 300 MG/ML solution 75 mL (75 mLs Intravenous Contrast Given 05/18/18 0357)  gabapentin (NEURONTIN) capsule 600 mg (600 mg Oral Given 05/18/18 0511)     Initial Impression / Assessment and Plan / ED Course  I have reviewed the triage vital signs and the nursing notes.  Pertinent labs & imaging results that were available during my care of the patient were reviewed by me and considered in my medical decision making (see chart for details).     Patient presents with 1 month of right-sided facial pain.  On clinical exam, there is no evidence of otitis media or otitis externa.  There is no rash on her scalp or face to suggest shingles.  Patient has no trismus to suggest TMJ dysfunction.  No significant swelling of the parotid or submandibular glands.  No lymphadenopathy of the head or neck.  Patient is able to eat without difficulty.  No dental pain or clinical signs of dental infection.  Patient less likely to have joint cell arteritis due to clinical course and lack of vision changes.  CT scan is without acute abnormality, specifically no abnormality of the right TMJ and middle ear.  No signs of abscess or infection.  I personally evaluated these images.  Suspect trigeminal neuralgia.  Patient will be discharged with gabapentin.  Discussed importance of close follow-up and reasons to return immediately to the emergency department.  Patient states understanding and is in agreement with this plan.  Final Clinical Impressions(s) / ED Diagnoses   Final diagnoses:  Trigeminal neuralgia of right side of face    ED Discharge Orders        Ordered    gabapentin (NEURONTIN) 100 MG capsule  3 times daily     05/18/18 0522       Trayon Krantz, [place], PA-C 05/18/18 0533    Horton, Mayer Masker, MD 05/18/18 [phone]

## 2018-05-18 NOTE — Discharge Instructions (Addendum)
1. Medications: Gabapentin, usual home medications 2. Treatment: rest, drink plenty of fluids,  3. Follow Up: Please followup with your primary doctor and/or 3-5 days for discussion of your diagnoses and further evaluation after today's visit; if you do not have a primary care doctor use the resource guide provided to find one; Please return to the ER for worsening pain, difficulty opening your mouth, high fevers, development of a rash, vision changes or other concerns

## 2018-05-18 NOTE — ED Notes (Signed)
Patient transported to CT 

## 2018-05-20 ENCOUNTER — Ambulatory Visit: Payer: Self-pay | Admitting: Neurology

## 2018-05-20 ENCOUNTER — Other Ambulatory Visit: Payer: Self-pay

## 2018-05-20 ENCOUNTER — Encounter: Payer: Self-pay | Admitting: Neurology

## 2018-05-20 VITALS — BP 130/86 | HR 84 | Resp 18 | Ht 65.0 in | Wt 201.0 lb

## 2018-05-20 DIAGNOSIS — R51 Headache: Secondary | ICD-10-CM

## 2018-05-20 DIAGNOSIS — G501 Atypical facial pain: Secondary | ICD-10-CM

## 2018-05-20 DIAGNOSIS — H9201 Otalgia, right ear: Secondary | ICD-10-CM | POA: Insufficient documentation

## 2018-05-20 DIAGNOSIS — R519 Headache, unspecified: Secondary | ICD-10-CM

## 2018-05-20 MED ORDER — GABAPENTIN 300 MG PO CAPS: 300.0000 mg | ORAL_CAPSULE | Freq: Three times a day (TID) | ORAL | 11 refills | Status: DC

## 2018-05-20 MED ORDER — METHYLPREDNISOLONE 4 MG PO TBPK: ORAL_TABLET | ORAL | 0 refills | Status: AC

## 2018-05-20 NOTE — Progress Notes (Addendum)
[facility]  PATIENT: Leah [name] DOB: Nov 10, 1976  REFERRING DOCTOR OR PCP:  [facility] SOURCE: patient, notes from ED, imaging and lab results, CT images personally reviewed  _________________________________   HISTORICAL  CHIEF COMPLAINT:  Chief Complaint  Patient presents with  . Facial Pain    Right sided ear/facial pain onset around 05/07/18.  Seen by [facility] ER, [facility] ER and pcp for same.  CT maxillofacial done 05/18/18 and she was dx.with trigeminal neuralgia.  Sts. no relief with Hydrocodone, Ibuprofen. She had Prednisone left over from an old rx. and took it; sts. it helped. Gabapentin also helping./fim    HISTORY OF PRESENT ILLNESS:  I had the pleasure seeing patient, Leah [name], and [facility] for neurologic consultation regarding her right facial pain.    She is a 42 year old woman who had the onset of the right sided facial and ear pain in mid May with a dull ache.  Pain worsened around 05/07/2018.   Pain felt achy and was constant with fluctuations.   Nothing seemed to trigger any fluctuation in intensity.   No position of her mouth changed the pain. She also had right facial allodynia with touch feeling painful.    She was first diagnosed with an ear infection and given drops.   At the ED 05/13/2018 she was told she had an ear infection and was given a steroid and antibiotic.   The steroid helped x 2 days but pain returned and she re-presented to the ED.      She was seen in the [facility] 05/17/2018 and was prescribed hydrocodone (no benefit).  She went to [facility] ED 05/18/2018 and gabapentin was prescribed with mild benefit for the allodynia but not the ear pain.   Balance is slightly off but hearing is symmetric.     She had a CT maxillofacial showing "Normal maxillofacial CT. Specifically, normal appearance of the right temporomandibular joint and middle ear".   Films were  reviewed and I concur  She is otherwise healthy.    She has seasonal allergies and anxiety.      REVIEW OF SYSTEMS: Constitutional: No fevers, chills, sweats, or change in appetite Eyes: No visual changes, double vision, eye pain Ear, nose and throat: No hearing loss, ear pain, nasal congestion, sore throat Cardiovascular: No chest pain, palpitations Respiratory: No shortness of breath at rest or with exertion.   No wheezes GastrointestinaI: No nausea, vomiting, diarrhea, abdominal pain, fecal incontinence Genitourinary: No dysuria, urinary retention or frequency.  No nocturia. Musculoskeletal: No neck pain, back pain Integumentary: No rash, pruritus, skin lesions Neurological: as above Psychiatric: No depression at this time.  She has anxiety Endocrine: No palpitations, diaphoresis, change in appetite, change in weigh or increased thirst Hematologic/Lymphatic: No anemia, purpura, petechiae. Allergic/Immunologic: Some seasonal allergies.  ALLERGIES: No Known Allergies  HOME MEDICATIONS:  Current Outpatient Medications:  .  acetaminophen (TYLENOL) 500 MG tablet, Take 500 mg by mouth every 6 (six) hours as needed., Disp: , Rfl:  .  cetirizine (ZYRTEC) 10 MG tablet, Take 10 mg by mouth daily., Disp: , Rfl:  .  gabapentin (NEURONTIN) 100 MG capsule, Take 1 capsule (100 mg total) by mouth 3 (three) times daily., Disp: 60 capsule, Rfl: 0 .  ibuprofen (ADVIL,MOTRIN) 200 MG tablet, Take 200 mg by mouth every 6 (six) hours as needed., Disp: , Rfl:  .  gabapentin (NEURONTIN) 300 MG capsule, Take 1 capsule (300 mg total) by mouth  3 (three) times daily., Disp: 90 capsule, Rfl: 11 .  methylPREDNISolone (MEDROL DOSEPAK) 4 MG TBPK tablet, Take over 6 days, start with 6 pills po the first day and taper off over 6 days, Disp: 21 tablet, Rfl: 0  PAST MEDICAL HISTORY: Past Medical History:  Diagnosis Date  . Anxiety     PAST SURGICAL HISTORY: History reviewed. No pertinent surgical  history.  FAMILY HISTORY: Family History  Problem Relation Age of Onset  . Cancer Mother   . Cancer Other     SOCIAL HISTORY:  Social History   Socioeconomic History  . Marital status: Single    Spouse name: Not on file  . Number of children: Not on file  . Years of education: Not on file  . Highest education level: Not on file  Occupational History  . Not on file  Social Needs  . Financial resource strain: Not on file  . Food insecurity:    Worry: Not on file    Inability: Not on file  . Transportation needs:    Medical: Not on file    Non-medical: Not on file  Tobacco Use  . Smoking status: Current Every Day Smoker    Packs/day: 1.00    Types: Cigarettes  . Smokeless tobacco: Never Used  Substance and Sexual Activity  . Alcohol use: Yes    Comment: social  . Drug use: No  . Sexual activity: Yes    Birth control/protection: None  Lifestyle  . Physical activity:    Days per week: Not on file    Minutes per session: Not on file  . Stress: Not on file  Relationships  . Social connections:    Talks on phone: Not on file    Gets together: Not on file    Attends religious service: Not on file    Active member of club or organization: Not on file    Attends meetings of clubs or organizations: Not on file    Relationship status: Not on file  . Intimate partner violence:    Fear of current or ex partner: Not on file    Emotionally abused: Not on file    Physically abused: Not on file    Forced sexual activity: Not on file  Other Topics Concern  . Not on file  Social History Narrative  . Not on file     PHYSICAL EXAM  Vitals:   05/20/18 1056  BP: 130/86  Pulse: 84  Resp: 18  Weight: 201 lb (91.2 kg)  Height:  (1.651 m)    Body mass index is 33.45 kg/m.   General: The patient is well-developed and well-nourished and in no acute distress  HEENT:  [place]/AT, tympanic membrane and ear canals normal.   Tender to palpation over right mastods.   Funduscopic exam shows normal optic discs and retinal vessels.  Neck: The neck is supple, no carotid bruits are noted.  The neck is mildly teender at the ociput.  Cardiovascular: The heart has a regular rate and rhythm with a normal S1 and S2. There were no murmurs, gallops or rubs.    Skin: Extremities are without rash or edema.   Neurologic Exam  Mental status: The patient is alert and oriented x 3 at the time of the examination. The patient has apparent normal recent and remote memory, with an apparently normal attention span and concentration ability.   Speech is normal.  Cranial nerves: Extraocular movements are full. Pupils are equal, round, and reactive  to light and accomodation.  Visual fields are full.  Facial symmetry is present. There is good facial sensation to soft touch bilaterally.Facial strength is normal.  Trapezius and sternocleidomastoid strength is normal. No dysarthria is noted.  The tongue is midline, and the patient has symmetric elevation of the soft palate. No obvious hearing deficits are noted.  Motor:  Muscle bulk is normal.   Tone is normal. Strength is  5 / 5 in all 4 extremities.   Sensory: Sensory testing is intact to pinprick, soft touch and vibration sensation in all 4 extremities.  Coordination: Cerebellar testing reveals good finger-nose-finger and heel-to-shin bilaterally.  Gait and station: Station is normal.   Gait is normal. Tandem gait is normal. Romberg is negative.   Reflexes: Deep tendon reflexes are symmetric and normal bilaterally.   Plantar responses are flexor.    DIAGNOSTIC DATA (LABS, IMAGING, TESTING) - I reviewed patient records, labs, notes, testing and imaging myself where available.  Lab Results  Component Value Date   WBC 9.5 05/18/2018   HGB 14.5 05/18/2018   HCT 43.0 05/18/2018   MCV 86.7 05/18/2018   PLT 320 05/18/2018      Component Value Date/Time   NA 140 05/18/2018 0254   K 4.1 05/18/2018 0254   CL 106 05/18/2018  0254   CO2 25 05/18/2018 0254   GLUCOSE 94 05/18/2018 0254   BUN 11 05/18/2018 0254   CREATININE 0.57 05/18/2018 0254   CALCIUM 9.2 05/18/2018 0254   GFRNONAA >60 05/18/2018 0254   GFRAA >60 05/18/2018 0254       ASSESSMENT AND PLAN  Atypical face pain  Right ear pain  Intractable headache, unspecified chronicity pattern, unspecified headache type    In summary, Ms. Lahm is a 42 year old woman with right facial pain and tenderness near the ear.  The etiology is unclear.  She did not appear to have any evidence of mastoiditis or other inflammatory lesion on the CT scan.  However, she did feel better after a couple days of steroids.  She may be getting a little bit better.  I will have her do a 6-day steroid pack and we will increase the gabapentin to 300 mg p.o. 3 times daily.  If she is not better by the end of the steroid pack, she should call us back and then I will want to check an MRI of the head to make sure that there is not inflammation or mass lesion causing her symptoms.  She will follow-up as needed based on how she does over the next couple weeks and the results of any imaging if needed.  Thank you for asking me to see Ms. Hedger.  Please let me know if I can be of further assistance with her or other patients in the future.   Arneisha Kincannon A. Epimenio Foot, MD, [facility] 05/20/2018, 12:58 PM Certified in Neurology, Clinical Neurophysiology, Sleep Medicine, Pain Medicine and Neuroimaging  [facility] [address] [place], [place] [address] [phone]

## 2018-05-20 NOTE — Patient Instructions (Signed)
STEROID:   6 pills po x 1 day, then 5 pills po x 1 day, then 4 pills po x 1 day, then 3 pills po x 1 day, then 2 pills po x 1 day, then 1 pill po x 1 day  Increase the gabapentin to 100 mg in morning, 100 mg afternoon and 300 mg at night x 3 days then increase to 200 mg in the mornng and 200 mg in the afternoon and 300 mg at night.    Then increase to 300 mg three times a day (new prescription)

## 2018-05-23 ENCOUNTER — Emergency Department (HOSPITAL_COMMUNITY)
Admission: EM | Admit: 2018-05-23 | Discharge: 2018-05-23 | Disposition: A | Discharge status: Home / Self Care | Payer: Self-pay | Attending: Emergency Medicine | Admitting: Emergency Medicine

## 2018-05-23 ENCOUNTER — Emergency Department (HOSPITAL_COMMUNITY): Payer: Self-pay

## 2018-05-23 ENCOUNTER — Encounter (HOSPITAL_COMMUNITY): Payer: Self-pay | Admitting: Emergency Medicine

## 2018-05-23 DIAGNOSIS — R51 Headache: Secondary | ICD-10-CM | POA: Insufficient documentation

## 2018-05-23 DIAGNOSIS — R29810 Facial weakness: Secondary | ICD-10-CM | POA: Insufficient documentation

## 2018-05-23 DIAGNOSIS — Z79899 Other long term (current) drug therapy: Secondary | ICD-10-CM | POA: Insufficient documentation

## 2018-05-23 DIAGNOSIS — R519 Headache, unspecified: Secondary | ICD-10-CM

## 2018-05-23 DIAGNOSIS — F17210 Nicotine dependence, cigarettes, uncomplicated: Secondary | ICD-10-CM | POA: Insufficient documentation

## 2018-05-23 LAB — PROTIME-INR
INR: 0.96
Prothrombin Time: 12.7 seconds (ref 11.4–15.2)

## 2018-05-23 LAB — COMPREHENSIVE METABOLIC PANEL
ALT: 15 U/L (ref 14–54)
ANION GAP: 9 (ref 5–15)
AST: 16 U/L (ref 15–41)
Albumin: 3.6 g/dL (ref 3.5–5.0)
Alkaline Phosphatase: 54 U/L (ref 38–126)
BILIRUBIN TOTAL: 0.6 mg/dL (ref 0.3–1.2)
BUN: 12 mg/dL (ref 6–20)
CO2: 23 mmol/L (ref 22–32)
Calcium: 8.8 mg/dL — ABNORMAL LOW (ref 8.9–10.3)
Chloride: 105 mmol/L (ref 101–111)
Creatinine, Ser: 0.7 mg/dL (ref 0.44–1.00)
Glucose, Bld: 95 mg/dL (ref 65–99)
POTASSIUM: 3.6 mmol/L (ref 3.5–5.1)
Sodium: 137 mmol/L (ref 135–145)
TOTAL PROTEIN: 7 g/dL (ref 6.5–8.1)

## 2018-05-23 LAB — CBC
HEMATOCRIT: 43.5 % (ref 36.0–46.0)
Hemoglobin: 13.8 g/dL (ref 12.0–15.0)
MCH: 27.6 pg (ref 26.0–34.0)
MCHC: 31.7 g/dL (ref 30.0–36.0)
MCV: 87 fL (ref 78.0–100.0)
PLATELETS: 328 10*3/uL (ref 150–400)
RBC: 5 MIL/uL (ref 3.87–5.11)
RDW: 13.6 % (ref 11.5–15.5)
WBC: 17.4 10*3/uL — ABNORMAL HIGH (ref 4.0–10.5)

## 2018-05-23 LAB — DIFFERENTIAL
BASOS ABS: 0.2 10*3/uL — AB (ref 0.0–0.1)
BASOS PCT: 1 %
EOS ABS: 0.2 10*3/uL (ref 0.0–0.7)
EOS PCT: 1 %
LYMPHS ABS: 6.6 10*3/uL — AB (ref 0.7–4.0)
Lymphocytes Relative: 38 %
MONO ABS: 1.4 10*3/uL — AB (ref 0.1–1.0)
Monocytes Relative: 8 %
Neutro Abs: 9 10*3/uL — ABNORMAL HIGH (ref 1.7–7.7)
Neutrophils Relative %: 52 %

## 2018-05-23 LAB — C-REACTIVE PROTEIN: CRP: <0.8 mg/dL (ref <1.0)

## 2018-05-23 LAB — RAPID URINE DRUG SCREEN, HOSP PERFORMED
Amphetamines: NOT DETECTED
BARBITURATES: NOT DETECTED
Benzodiazepines: POSITIVE — AB
COCAINE: NOT DETECTED
OPIATES: NOT DETECTED
Tetrahydrocannabinol: NOT DETECTED

## 2018-05-23 LAB — I-STAT BETA HCG BLOOD, ED (MC, WL, AP ONLY)

## 2018-05-23 LAB — SEDIMENTATION RATE: Sed Rate: 5 mm/hr (ref 0–22)

## 2018-05-23 LAB — APTT: APTT: 27 s (ref 24–36)

## 2018-05-23 MED ORDER — METOCLOPRAMIDE HCL 5 MG/ML IJ SOLN
10.0000 mg | Freq: Once | INTRAMUSCULAR | Status: AC
  Administered 2018-05-23: 10 mg via INTRAVENOUS
  Filled 2018-05-23: qty 2

## 2018-05-23 MED ORDER — KETOROLAC TROMETHAMINE 30 MG/ML IJ SOLN
30.0000 mg | Freq: Once | INTRAMUSCULAR | Status: AC
  Administered 2018-05-23: 30 mg via INTRAVENOUS
  Filled 2018-05-23: qty 1

## 2018-05-23 MED ORDER — LORAZEPAM 0.5 MG PO TABS
0.5000 mg | ORAL_TABLET | Freq: Once | ORAL | Status: AC
  Administered 2018-05-23: 0.5 mg via ORAL
  Filled 2018-05-23: qty 1

## 2018-05-23 MED ORDER — SODIUM CHLORIDE 0.9 % IV BOLUS
1000.0000 mL | Freq: Once | INTRAVENOUS | Status: AC
  Administered 2018-05-23: 1000 mL via INTRAVENOUS

## 2018-05-23 NOTE — Progress Notes (Signed)
05/23/18 @ 1338  Pt refusing exam due to claustrophobia after given meds.

## 2018-05-23 NOTE — ED Triage Notes (Addendum)
Patient complains of persistent headache for the last month that suddenly got worse at 2200 last night. Patient complains of right sided weakness, right grip is slightly weaker than left but patient frequently using cellular phone with right hand without difficulty, no drift, , no facial droop, no vision changes, alert and oriented and in no apparent distress at this time.

## 2018-05-23 NOTE — ED Notes (Signed)
Patient transported to MRI 

## 2018-05-23 NOTE — Discharge Instructions (Addendum)
Lab work and CT head today was normal.   You were unable to undergo MRI.   Call Dr Epimenio FootSater tomorrow and schedule follow up. Continue all home medications.  Return for severe head pain, rash, facial drooping, difficulty with speech or ambulating, one sided numbness or weakness

## 2018-05-23 NOTE — ED Provider Notes (Signed)
[facility] EMERGENCY DEPARTMENT Provider Note   CSN: [id number] Arrival date & time: 05/23/18  0844     History   Chief Complaint Chief Complaint  Patient presents with  . Headache    HPI Leah [name] is a 42 y.o. female with h/o of anxiety here for evaluation of persistent, constant right sided head and facial pain x 1 month. Pain described as burning and sharp, moderate to severe at times. She noticed right lip drooping around 0800 today. She called her friend who is an Char[employer] fundraiser and was told to come to ER for evaluation.  Her friend at bedside still notices slight and improved right lip droop. Associated symptoms include odd tongue sensation described as "it feels swollen". Denies numbness, weakness, or heaviness distally otherwise, vision changes, nausea, vomiting, neck pain or stiffness, rash, presyncope, difficulty with speech or walk.  This is her third ER visit for same in one week. Was seen by [facility] on 5/30 and etiology unclear. Neurology considering MRI of the head to r/o inflammation vs mass lesion. Symptoms refractory to gabapentin TID, steroid taper, hydrocodone. Has been on abx ear drops for suspected ear infection.  Has had CT maxillofacial in last week.  HPI  Past Medical History:  Diagnosis Date  . Anxiety     Patient Active Problem List   Diagnosis Date Noted  . Right ear pain 05/20/2018  . Atypical face pain 05/20/2018    History reviewed. No pertinent surgical history.   OB History    Gravida  5   Para  3   Term  0   Preterm      AB  2   Living        SAB      TAB      Ectopic      Multiple      Live Births               Home Medications    Prior to Admission medications   Medication Sig Start Date End Date Taking? Authorizing Provider  acetaminophen (TYLENOL) 500 MG tablet Take 500 mg by mouth every 6 (six) hours as needed.    [provider]  cetirizine (ZYRTEC) 10 MG tablet Take 10 mg by  mouth daily.    [provider]  gabapentin (NEURONTIN) 100 MG capsule Take 1 capsule (100 mg total) by mouth 3 (three) times daily. 05/18/18   Muthersbaugh, Dahlia Client, PA-C  gabapentin (NEURONTIN) 300 MG capsule Take 1 capsule (300 mg total) by mouth 3 (three) times daily. 05/20/18   Sater, Pearletha Furl, MD  ibuprofen (ADVIL,MOTRIN) 200 MG tablet Take 200 mg by mouth every 6 (six) hours as needed.    [provider]  methylPREDNISolone (MEDROL DOSEPAK) 4 MG TBPK tablet Take over 6 days, start with 6 pills po the first day and taper off over 6 days 05/20/18   Sater, Pearletha Furl, MD    Family History Family History  Problem Relation Age of Onset  . Cancer Mother   . Cancer Other     Social History Social History   Tobacco Use  . Smoking status: Current Every Day Smoker    Packs/day: 1.00    Types: Cigarettes  . Smokeless tobacco: Never Used  Substance Use Topics  . Alcohol use: Yes    Comment: social  . Drug use: No     Allergies   Patient has no known allergies.   Review of Systems Review of  Systems  HENT: Positive for ear pain (and facial pain).   Neurological: Positive for headaches.       Right lip droop   All other systems reviewed and are negative.    Physical Exam Updated Vital Signs BP 122/74 (BP Location: Left Arm)   Pulse 67   Temp 98.3 F (36.8 C) (Oral)   Resp 20   SpO2 98%   Physical Exam  Constitutional: She appears well-developed.  Non-toxic appearance.  In NAD sitting on hall bed playing cell phone game during conversation with bilateral hands   HENT:  Head: Normocephalic and atraumatic.    Right Ear: External ear normal.  Left Ear: External ear normal.  Nose: Nose normal. No mucosal edema or septal deviation.  Diffuse right sided scalp, facial and jaw tenderness without overlaying rash, edema, erythema, lesions Moist mucous membranes Uvula midline Oropharynx and tonsils normal  Eyes: Conjunctivae and lids are normal.  Unable to  visualize back of eye. PERRL and EOMs intact bilaterally. Conjunctiva and sclera normal  Neck:  No c spine spinous process or muscular tenderness  Full PROM of neck w/o rigidity  No meningeal signs   Cardiovascular: Normal rate, regular rhythm and normal heart sounds.  Pulses:      Radial pulses are 2+ on the right side, and 2+ on the left side.       Dorsalis pedis pulses are 2+ on the right side, and 2+ on the left side.  Pulmonary/Chest: Effort normal and breath sounds normal.  Lymphadenopathy:  No cervical adenopathy  Neurological: She is alert. GCS eye subscore is 4. GCS verbal subscore is 5. GCS motor subscore is 6.  Alert and oriented to self, place, time and event.  Speech is fluent without obvious dysarthria or dysphasia. Strength 5/5 with hand grip and ankle F/E.   Sensation to light touch intact in hands and feet. Normal gait. No pronator drift. No leg drop.  Normal finger-to-nose and finger tapping.  CN I and VIII not tested. CN II-XII grossly intact bilaterally.  Knee DTR symmetric. No ankle clonus.   Skin: Skin is warm and dry. Capillary refill takes less than 2 seconds. No rash noted.  Psychiatric: She has a normal mood and affect. Her speech is normal and behavior is normal. Judgment and thought content normal.     ED Treatments / Results  Labs (all labs ordered are listed, but only abnormal results are displayed) Labs Reviewed  CBC - Abnormal; Notable for the following components:      Result Value   WBC 17.4 (*)    All other components within normal limits  DIFFERENTIAL - Abnormal; Notable for the following components:   Neutro Abs 9.0 (*)    Lymphs Abs 6.6 (*)    Monocytes Absolute 1.4 (*)    Basophils Absolute 0.2 (*)    All other components within normal limits  COMPREHENSIVE METABOLIC PANEL - Abnormal; Notable for the following components:   Calcium 8.8 (*)    All other components within normal limits  RAPID URINE DRUG SCREEN, HOSP PERFORMED -  Abnormal; Notable for the following components:   Benzodiazepines POSITIVE (*)    All other components within normal limits  PROTIME-INR  APTT  SEDIMENTATION RATE  C-REACTIVE PROTEIN  I-STAT TROPONIN, ED  CBG MONITORING, ED  I-STAT BETA HCG BLOOD, ED ([facility], [facility], [facility] ONLY)    EKG None  Radiology Ct Head Wo Contrast  Result Date: 05/23/2018 CLINICAL DATA:  Headache for the past month, suddenly  worsening last night. EXAM: CT HEAD WITHOUT CONTRAST TECHNIQUE: Contiguous axial images were obtained from the base of the skull through the vertex without intravenous contrast. COMPARISON:  None. FINDINGS: Brain: All areas of the brain demonstrate normal gray-white matter differentiation. No hydrocephalus. No mass, hemorrhage, edema or other evidence of acute parenchymal abnormality. No extra-axial hemorrhage. Vascular: No hyperdense vessel or unexpected calcification. Skull: Normal. Negative for fracture or focal lesion. Sinuses/Orbits: No acute finding. Other: None. IMPRESSION: Negative head CT.  No intracranial mass, hemorrhage or edema. Electronically Signed   By: Bary RichardStan  Maynard M.D.   On: 05/23/2018 09:58    Procedures Procedures (including critical care time)  Medications Ordered in ED Medications  metoCLOPramide (REGLAN) injection 10 mg (10 mg Intravenous Given 05/23/18 1154)  sodium chloride 0.9 % bolus 1,000 mL (0 mLs Intravenous Stopped 05/23/18 1515)  ketorolac (TORADOL) 30 MG/ML injection 30 mg (30 mg Intravenous Given 05/23/18 1154)  LORazepam (ATIVAN) tablet 0.5 mg (0.5 mg Oral Given 05/23/18 1305)     Initial Impression / Assessment and Plan / ED Course  I have reviewed the triage vital signs and the nursing notes.  Pertinent labs & imaging results that were available during my care of the patient were reviewed by me and considered in my medical decision making (see chart for details).  Clinical Course as of May 23 1544  Sun May 23, 2018  1044 WBC(!): 17.4 [CG]    Clinical Course User  Index [CG] Liberty HandyGibbons, Sebastien Jackson J, [place]-C   42 yo here with persistent right sided head and facial pain. Pt and friend at bedside report lip drooping onset 0800 that has since improved by persistent. On my exam, right bottom lip appears slightly edematous more than drooping.  Friend and pt adamant this is not normal. She has no other red flag symptoms such as vision changes, overlaying rash, neck stiffness of rigidity, preceding trauma. Neuro exam including CN otherwise intact and symmetric. Symptoms have been refractory to gabapentin, hydrocodone.  This is third ER visit in the last week for same. Saw neurology yesterday.  1130: Given persistent symptoms, reported lip drooping and considering of MRI by neurology two days ago I consulted Dr Amada JupiterKirkpatrick.  He suspect trigeminal neuralgia.  Recommending MRI brain. Migraine cocktail ordered. Discussed plan with pt who is in agreement.   1245: Pt in NAD, asleep in hallway bed. MRI pending.  Final Clinical Impressions(s) / ED Diagnoses   1544: pt unable to go through MRI. She reports being claustrophobic. She received 0.5 lorazepam prior and refused higher dose.  Discussed risks of leaving without completion of recommended evaluation. She verbalized understanding and would prefer to be dc. She is encouraged to return at anytime to complete work up, f/u with neurology tomorrow for further evaluation. Will send Dr. Epimenio FootSater a message to facilitate f/u.  Final diagnoses:  Right-sided headache    ED Discharge Orders    None       Jerrell MylarGibbons, Magen Suriano J, PA-C 05/23/18 1545    Jacalyn LefevreHaviland, Julie, MD 05/26/18 1626

## 2018-05-25 ENCOUNTER — Telehealth: Payer: Self-pay | Admitting: Neurology

## 2018-05-25 MED ORDER — OXCARBAZEPINE 300 MG PO TABS: ORAL_TABLET | ORAL | 3 refills | Status: AC

## 2018-05-25 NOTE — Telephone Encounter (Signed)
Pt requesting a call stating she has finished her steroids, but feels the pain has gotten worse. Pt stating her pain is mainly throughout the right side of her face. Please call to advise

## 2018-05-25 NOTE — Telephone Encounter (Signed)
Spoke with Stryker CorporationKeisha. She reports worsening right sided facial pain despite compliance with Medrol dose pk. Per [facility] ov note, next step is MRI brain to r/o lesions that may be causing pain. She sts. she was in the ER on 05/23/18 and they ordered an MRI brain, but she was not able to complete it due to claustrophobia.  Also sts. she has no insurance, not able to afford MRI.  Taking Gabapentin 300mg  tid. Will check with [facility] for other options and call pt. back today/fim

## 2018-05-25 NOTE — Telephone Encounter (Signed)
Per [facility], ok for Oxcarbazepine 300mg , one po qam, 2 po qhs.  [facility]/fim

## 2018-05-25 NOTE — Telephone Encounter (Signed)
Spoke with Stryker CorporationKeisha. She is agreeable to trying Oxcarbazepine in addition to Gabapentin (b/c she sts. she is getting some benefit from Gabapentin).  Rx. escribed to [facility] per her request/fim

## 2018-05-25 NOTE — Addendum Note (Signed)
Addended by: Candis SchatzMISENHEIMER, Shan Valdes I on: 05/25/2018 11:11 AM   Modules accepted: Orders

## 2019-03-26 ENCOUNTER — Emergency Department (HOSPITAL_COMMUNITY): Payer: BLUE CROSS/BLUE SHIELD

## 2019-03-26 ENCOUNTER — Other Ambulatory Visit: Payer: Self-pay

## 2019-03-26 ENCOUNTER — Encounter (HOSPITAL_COMMUNITY): Payer: Self-pay | Admitting: Emergency Medicine

## 2019-03-26 ENCOUNTER — Emergency Department (HOSPITAL_COMMUNITY)
Admission: EM | Admit: 2019-03-26 | Discharge: 2019-03-26 | Disposition: A | Discharge status: Home / Self Care | Payer: BLUE CROSS/BLUE SHIELD | Attending: Emergency Medicine | Admitting: Emergency Medicine

## 2019-03-26 DIAGNOSIS — S39012A Strain of muscle, fascia and tendon of lower back, initial encounter: Secondary | ICD-10-CM | POA: Insufficient documentation

## 2019-03-26 DIAGNOSIS — Y33XXXA Other specified events, undetermined intent, initial encounter: Secondary | ICD-10-CM | POA: Insufficient documentation

## 2019-03-26 DIAGNOSIS — Y999 Unspecified external cause status: Secondary | ICD-10-CM | POA: Diagnosis not present

## 2019-03-26 DIAGNOSIS — Y929 Unspecified place or not applicable: Secondary | ICD-10-CM | POA: Insufficient documentation

## 2019-03-26 DIAGNOSIS — Y939 Activity, unspecified: Secondary | ICD-10-CM | POA: Diagnosis not present

## 2019-03-26 DIAGNOSIS — Z79899 Other long term (current) drug therapy: Secondary | ICD-10-CM | POA: Insufficient documentation

## 2019-03-26 DIAGNOSIS — S3992XA Unspecified injury of lower back, initial encounter: Secondary | ICD-10-CM | POA: Diagnosis present

## 2019-03-26 DIAGNOSIS — F17210 Nicotine dependence, cigarettes, uncomplicated: Secondary | ICD-10-CM | POA: Insufficient documentation

## 2019-03-26 LAB — URINALYSIS, ROUTINE W REFLEX MICROSCOPIC
Bilirubin Urine: NEGATIVE
Glucose, UA: NEGATIVE mg/dL
Ketones, ur: NEGATIVE mg/dL
Nitrite: NEGATIVE
Protein, ur: NEGATIVE mg/dL
Specific Gravity, Urine: 1.028 (ref 1.005–1.030)
pH: 5 (ref 5.0–8.0)

## 2019-03-26 LAB — PREGNANCY, URINE: Preg Test, Ur: NEGATIVE

## 2019-03-26 MED ORDER — LIDOCAINE 5 % EX PTCH
1.0000 | MEDICATED_PATCH | CUTANEOUS | Status: DC
  Administered 2019-03-26: 06:00:00 1 via TRANSDERMAL
  Filled 2019-03-26: qty 1

## 2019-03-26 MED ORDER — LIDOCAINE 5 % EX PTCH: 1.0000 | MEDICATED_PATCH | CUTANEOUS | 0 refills | Status: AC

## 2019-03-26 NOTE — Discharge Instructions (Addendum)
Alternate ice and heat to areas of injury 3-4 times per day to limit inflammation and spasm.  Avoid strenuous activity and heavy lifting.  We recommend consistent use of 600mg  ibuprofen/motrin/advil every 6 hours for pain.  Supplement this with lidoderm patches as needed.  We recommend follow-up with a primary care doctor to ensure resolution of symptoms.  Return to the ED for any new or concerning symptoms.

## 2019-03-26 NOTE — ED Triage Notes (Signed)
Patient complaining of mid back pain when she breathe. Patient states it has been going on all day.

## 2019-03-26 NOTE — ED Provider Notes (Addendum)
[facility]-EMERGENCY DEPT Provider Note   CSN: [id number] Arrival date & time: 03/26/19  0330    History   Chief Complaint Chief Complaint  Patient presents with  . Back Pain    HPI Leah [name] is a 43 y.o. female.     43 year old female presents to the emergency department for evaluation of mid back pain.  She has been having pain since yesterday.  This has been constant and is aggravated with breathing.  Slightly alleviated with stretching. Symptoms preceded by a nondescript cough as well as nasal congestion and rhinorrhea.  She saw her doctor 1 week ago who started her on a course of antibiotics, though she only took these for 3 days because she did not like taking pills.  She denies any fevers, chest pain, shortness of breath, vomiting, syncope.  The history is provided by the patient. No language interpreter was used.  Back Pain    Past Medical History:  Diagnosis Date  . Anxiety     Patient Active Problem List   Diagnosis Date Noted  . Right ear pain 05/20/2018  . Atypical face pain 05/20/2018    History reviewed. No pertinent surgical history.   OB History    Gravida  5   Para  3   Term  0   Preterm      AB  2   Living        SAB      TAB      Ectopic      Multiple      Live Births               Home Medications    Prior to Admission medications   Medication Sig Start Date End Date Taking? Authorizing Provider  acetaminophen (TYLENOL) 500 MG tablet Take 500 mg by mouth every 6 (six) hours as needed.    [provider]  cetirizine (ZYRTEC) 10 MG tablet Take 10 mg by mouth daily.    [provider]  gabapentin (NEURONTIN) 100 MG capsule Take 1 capsule (100 mg total) by mouth 3 (three) times daily. 05/18/18   Muthersbaugh, Dahlia Client, PA-C  gabapentin (NEURONTIN) 300 MG capsule Take 1 capsule (300 mg total) by mouth 3 (three) times daily. 05/20/18   Sater, Pearletha Furl, MD  ibuprofen (ADVIL,MOTRIN) 200  MG tablet Take 200 mg by mouth every 6 (six) hours as needed.    [provider]  lidocaine (LIDODERM) 5 % Place 1 patch onto the skin daily. Remove & Discard patch within 12 hours or as directed by MD 03/26/19   Antony Madura, PA-C  methylPREDNISolone (MEDROL DOSEPAK) 4 MG TBPK tablet Take over 6 days, start with 6 pills po the first day and taper off over 6 days 05/20/18   Sater, Pearletha Furl, MD  Oxcarbazepine (TRILEPTAL) 300 MG tablet Take one tablet in the morning and 2 tablets at bedtime 05/25/18   Sater, Pearletha Furl, MD    Family History Family History  Problem Relation Age of Onset  . Cancer Mother   . Cancer Other     Social History Social History   Tobacco Use  . Smoking status: Current Every Day Smoker    Packs/day: 1.00    Types: Cigarettes  . Smokeless tobacco: Never Used  Substance Use Topics  . Alcohol use: Yes    Comment: social  . Drug use: No     Allergies   Patient has no known allergies.   Review  of Systems Review of Systems  Musculoskeletal: Positive for back pain.  Ten systems reviewed and are negative for acute change, except as noted in the HPI.    Physical Exam Updated Vital Signs BP 138/85 (BP Location: Left Arm)   Pulse 78   Temp 98.1 F (36.7 C) (Oral)   Resp 15   Ht 5\' 5"  (1.651 m)   Wt 86.2 kg   LMP 03/23/2019   SpO2 98%   BMI 31.62 kg/m   Physical Exam Vitals signs and nursing note reviewed.  Constitutional:      General: She is not in acute distress.    Appearance: She is well-developed. She is not diaphoretic.     Comments: Nontoxic appearing and in NAD  HENT:     Head: Normocephalic and atraumatic.  Eyes:     General: No scleral icterus.    Conjunctiva/sclera: Conjunctivae normal.  Neck:     Musculoskeletal: Normal range of motion.  Cardiovascular:     Rate and Rhythm: Normal rate and regular rhythm.     Pulses: Normal pulses.  Pulmonary:     Effort: Pulmonary effort is normal. No respiratory distress.     Breath  sounds: No stridor. No wheezing.     Comments: Respirations even and unlabored Musculoskeletal: Normal range of motion.     Comments: No TTP to thoracic of lumbar spine or paraspinal muscles.  No bony deformities, step-offs, crepitus.  Skin:    General: Skin is warm and dry.     Coloration: Skin is not pale.     Findings: No erythema or rash.  Neurological:     Mental Status: She is alert and oriented to person, place, and time.  Psychiatric:        Behavior: Behavior normal.      ED Treatments / Results  Labs (all labs ordered are listed, but only abnormal results are displayed) Labs Reviewed  URINALYSIS, ROUTINE W REFLEX MICROSCOPIC - Abnormal; Notable for the following components:      Result Value   APPearance HAZY (*)    Hgb urine dipstick MODERATE (*)    Leukocytes,Ua TRACE (*)    Bacteria, UA RARE (*)    All other components within normal limits  PREGNANCY, URINE    EKG None  Radiology Dg Chest 2 View  Result Date: 03/26/2019 CLINICAL DATA:  Chest pain EXAM: CHEST - 2 VIEW COMPARISON:  12/11/2015 FINDINGS: The heart size and mediastinal contours are within normal limits. Both lungs are clear. The visualized skeletal structures are unremarkable. IMPRESSION: No active cardiopulmonary disease. Electronically Signed   By: Deatra RobinsonKevin  Herman M.D.   On: 03/26/2019 05:37    Procedures Procedures (including critical care time)  Medications Ordered in ED Medications  lidocaine (LIDODERM) 5 % 1 patch (has no administration in time range)     Initial Impression / Assessment and Plan / ED Course  I have reviewed the triage vital signs and the nursing notes.  Pertinent labs & imaging results that were available during my care of the patient were reviewed by me and considered in my medical decision making (see chart for details).        Patient with mid thoracic back pain, onset yesterday.  Symptoms preceded by a cough x1 week.  Pain is worse with breathing, slightly  alleviated with stretching.  Her chest x-ray shows no acute cardiopulmonary abnormality.  Specifically, no pneumothorax, pneumonia, effusion.  The patient's vitals have been stable.  She is afebrile and without hypoxia.  RICE protocol and pain medicine indicated and discussed with patient.  Return precautions discussed and provided.  Patient discharged in stable condition with no unaddressed concerns.   Final Clinical Impressions(s) / ED Diagnoses   Final diagnoses:  Back strain, initial encounter    ED Discharge Orders         Ordered    lidocaine (LIDODERM) 5 %  Every 24 hours     03/26/19 0621           Antony Madura, PA-C 03/26/19 0625    Antony Madura, PA-C 03/26/19 0625    Molpus, Jonny Ruiz, MD 03/26/19 [phone]

## 2019-06-11 ENCOUNTER — Other Ambulatory Visit: Payer: Self-pay | Admitting: Neurology

## 2019-07-06 ENCOUNTER — Other Ambulatory Visit: Payer: Self-pay | Admitting: Neurology

## 2019-09-23 ENCOUNTER — Other Ambulatory Visit: Payer: Self-pay

## 2019-09-23 DIAGNOSIS — Z20822 Contact with and (suspected) exposure to covid-19: Secondary | ICD-10-CM

## 2019-09-24 LAB — NOVEL CORONAVIRUS, NAA: SARS-CoV-2, NAA: NOT DETECTED

## 2020-01-07 IMAGING — CT CT MAXILLOFACIAL W/ CM
3 series · 16 of 47 positions shown, 19 images · IV contrast (omnipaque)
Comparison: None.

CLINICAL DATA: Right temporomandibular joint and ear pain.

EXAM:
CT MAXILLOFACIAL WITH CONTRAST
TECHNIQUE: Multidetector CT imaging of the maxillofacial structures was
performed with intravenous contrast. Multiplanar CT image
reconstructions were also generated.
CONTRAST:  75mL OMNIPAQUE IOHEXOL 300 MG/ML  SOLN

[Series 3: max soft · axial · 0.36mm/px · z∈[+1465,+1605]mm · 10 of 82 slices shown, 13 images]
[im 6/82  brain]
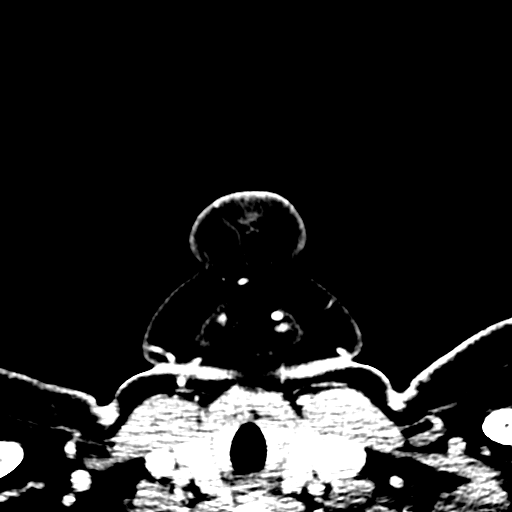
[im 6/82  bone]
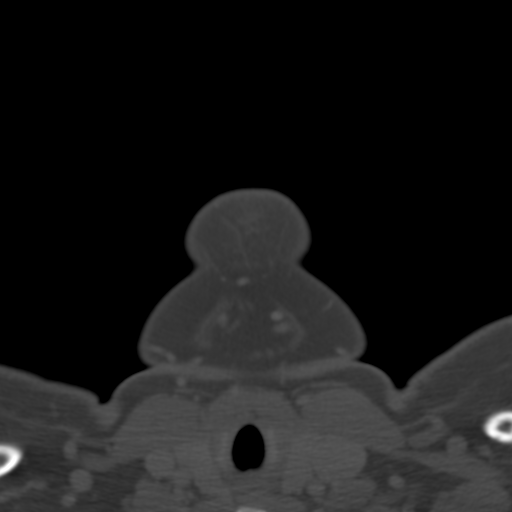
[im 14/82  bone]
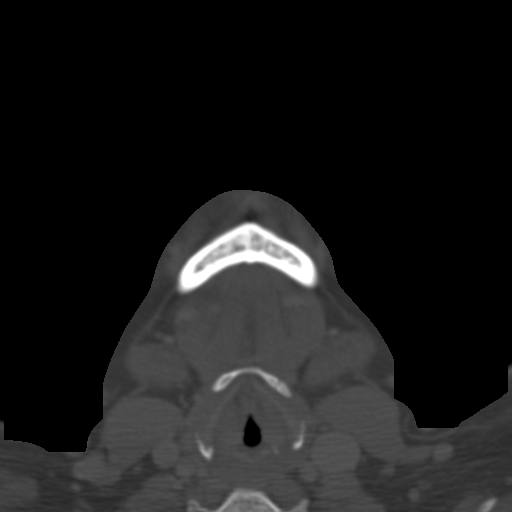
[im 23/82  bone]
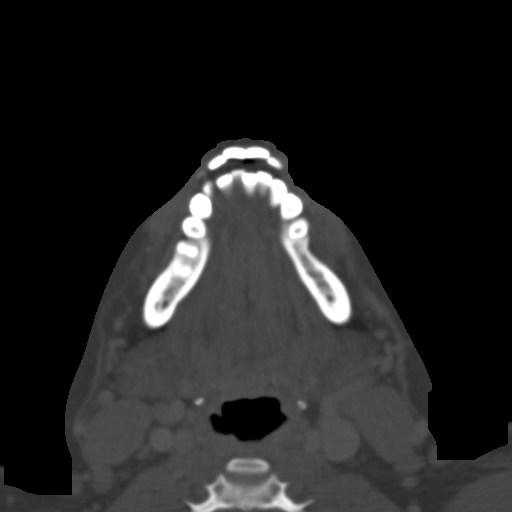
[im 28/82  bone]
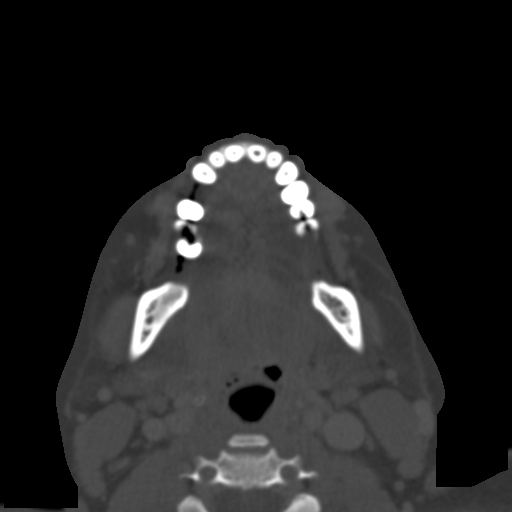
[im 37/82  brain]
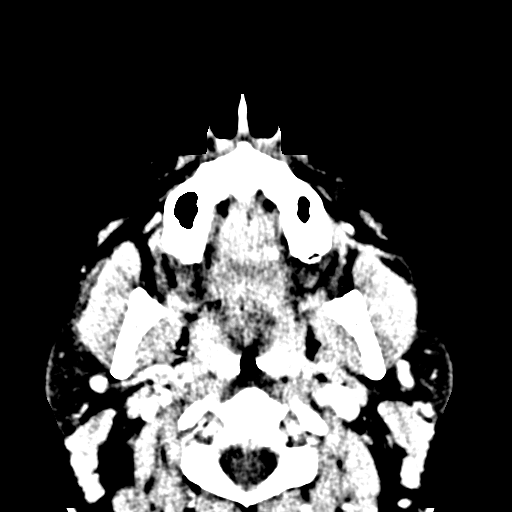
[im 37/82  bone]
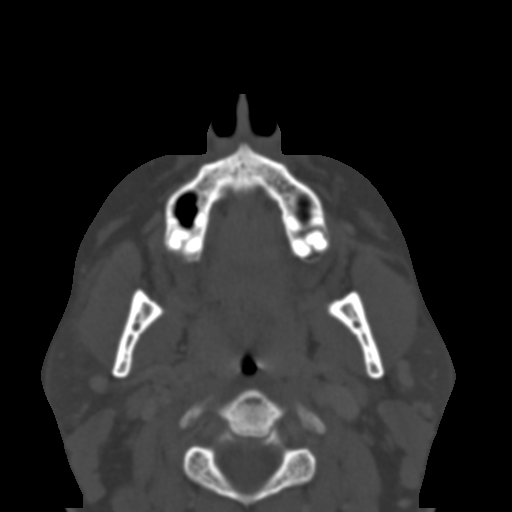
[im 45/82  bone]
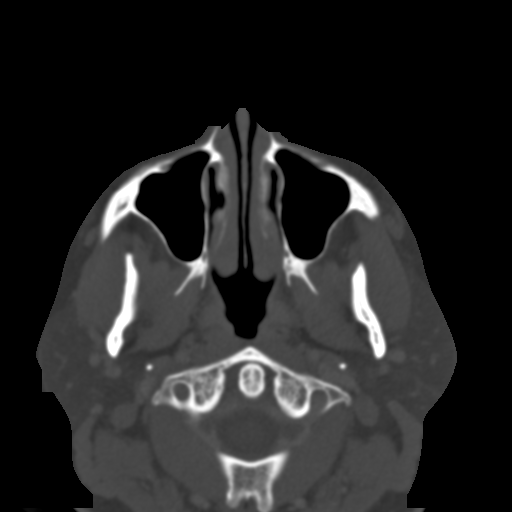
[im 54/82  bone]
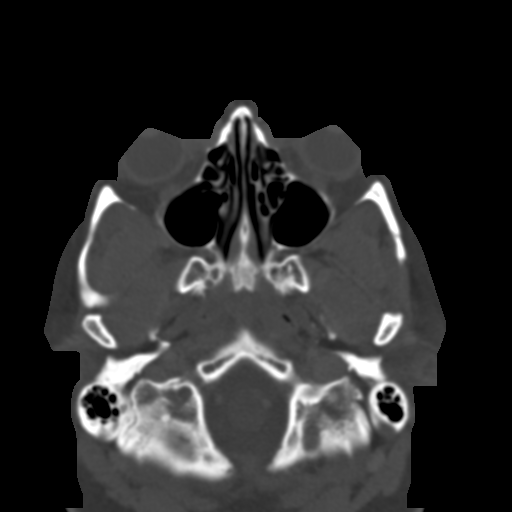
[im 62/82  bone]
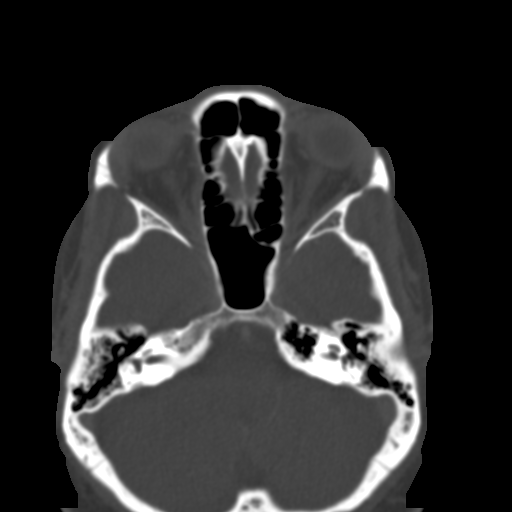
[im 68/82  brain]
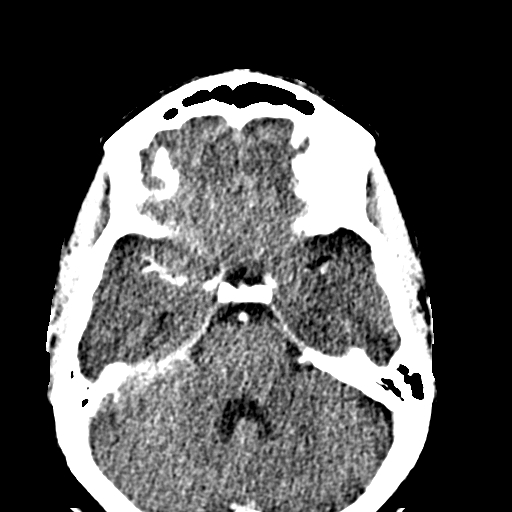
[im 68/82  bone]
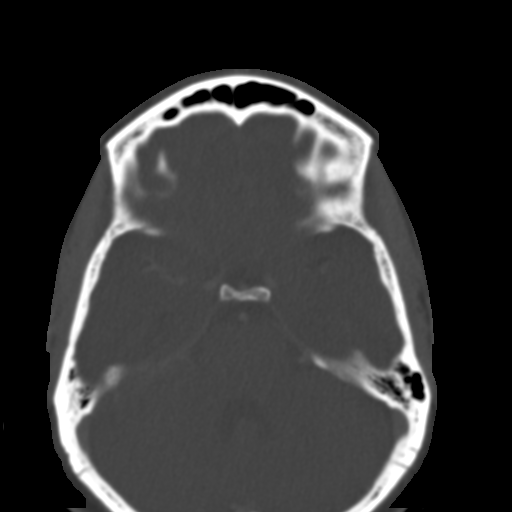
[im 76/82  bone]
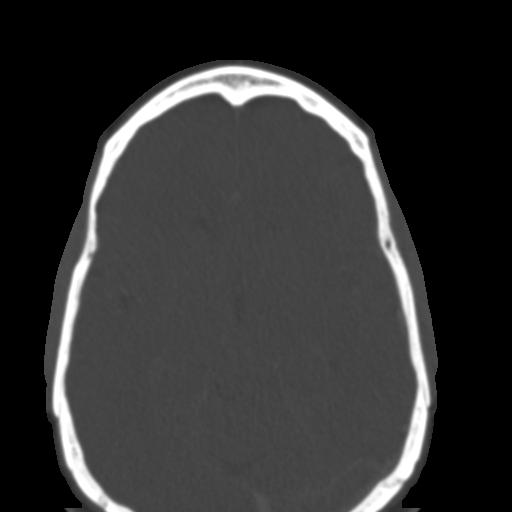

[Series 5: coronal soft · coronal · 0.31mm/px · 3 of 70 slices shown]
[im 24/70  bone]
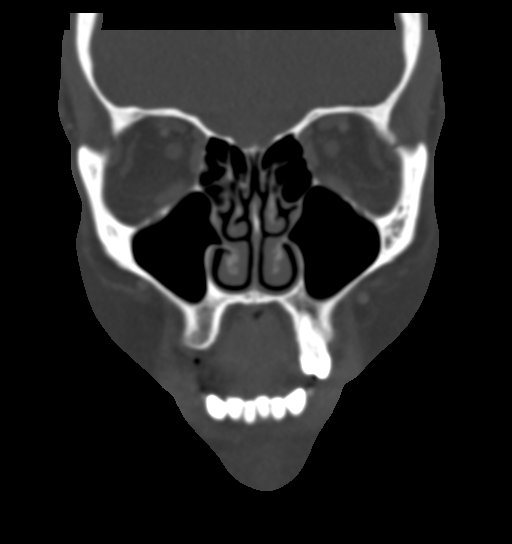
[im 31/70  bone]
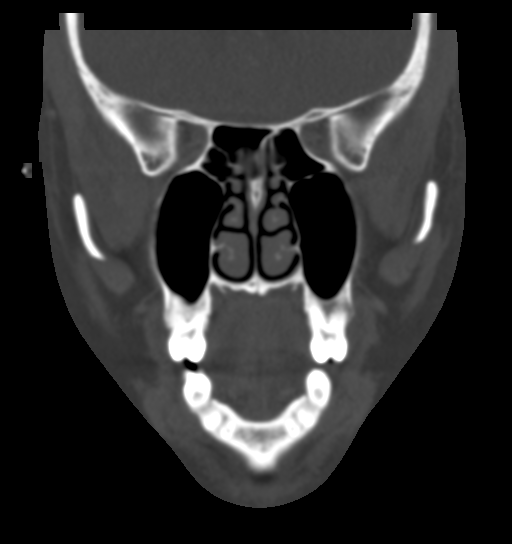
[im 39/70  bone]
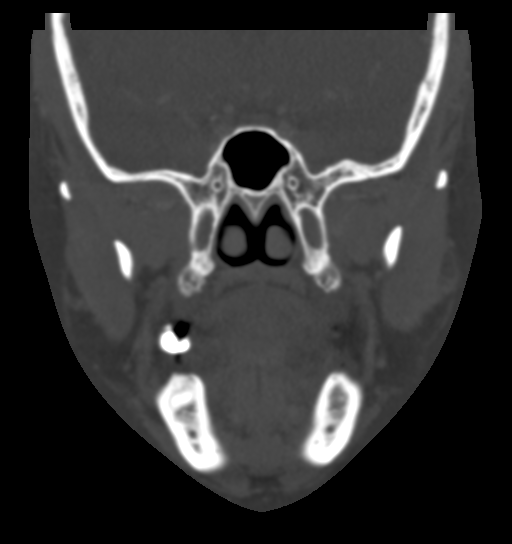

[Series 6: sagittal soft · sagittal · 0.32mm/px · 3 of 79 slices shown]
[im 27/79  bone]
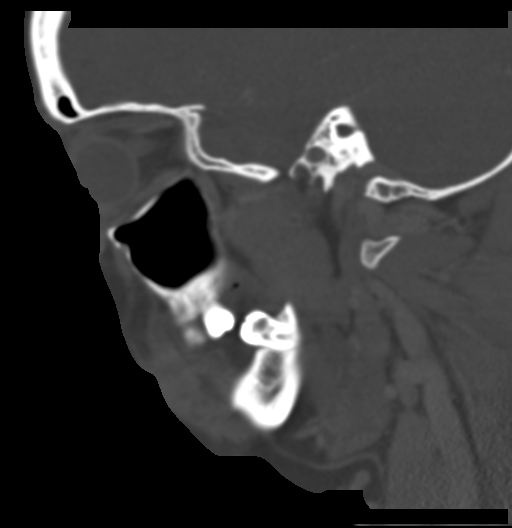
[im 40/79  bone]
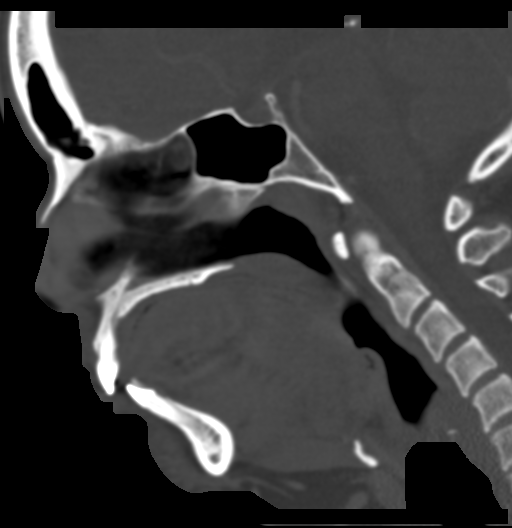
[im 53/79  bone]
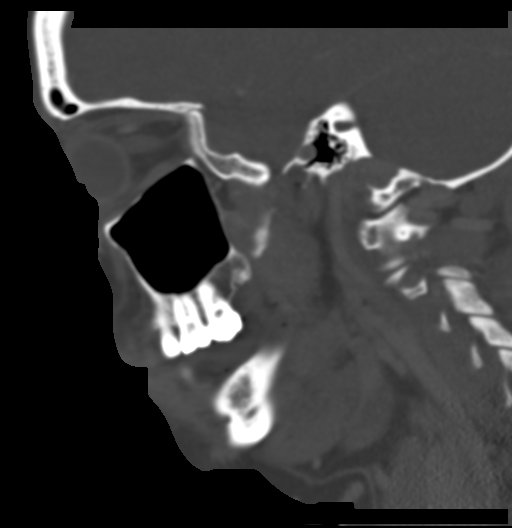

[16 of 47 positions shown; findings below may reference images not displayed]

FINDINGS: Osseous:

--Fractures: None.

--Mandible, hard palate and teeth: No acute abnormality.

Orbits: The globes are intact. Normal appearance of the intra- and
extraconal fat. Symmetric extraocular muscles.

Sinuses: No fluid levels or advanced mucosal thickening.

Soft tissues: Normal visualized extracranial soft tissues.

Limited intracranial: Normal.
IMPRESSION: Normal maxillofacial CT. Specifically, normal appearance of the
right temporomandibular joint and middle ear.

## 2020-03-21 ENCOUNTER — Ambulatory Visit: Payer: BLUE CROSS/BLUE SHIELD | Attending: Internal Medicine

## 2020-03-21 DIAGNOSIS — Z20822 Contact with and (suspected) exposure to covid-19: Secondary | ICD-10-CM

## 2020-03-22 LAB — NOVEL CORONAVIRUS, NAA: SARS-CoV-2, NAA: NOT DETECTED

## 2020-03-22 LAB — SARS-COV-2, NAA 2 DAY TAT

## 2021-06-06 ENCOUNTER — Emergency Department (HOSPITAL_BASED_OUTPATIENT_CLINIC_OR_DEPARTMENT_OTHER): Payer: BLUE CROSS/BLUE SHIELD

## 2021-06-06 ENCOUNTER — Encounter (HOSPITAL_BASED_OUTPATIENT_CLINIC_OR_DEPARTMENT_OTHER): Payer: Self-pay

## 2021-06-06 ENCOUNTER — Emergency Department (HOSPITAL_BASED_OUTPATIENT_CLINIC_OR_DEPARTMENT_OTHER)
Admission: EM | Admit: 2021-06-06 | Discharge: 2021-06-06 | Disposition: A | Discharge status: Home / Self Care | Payer: BLUE CROSS/BLUE SHIELD | Attending: Emergency Medicine | Admitting: Emergency Medicine

## 2021-06-06 ENCOUNTER — Other Ambulatory Visit: Payer: Self-pay

## 2021-06-06 DIAGNOSIS — F17210 Nicotine dependence, cigarettes, uncomplicated: Secondary | ICD-10-CM | POA: Diagnosis not present

## 2021-06-06 DIAGNOSIS — R61 Generalized hyperhidrosis: Secondary | ICD-10-CM | POA: Insufficient documentation

## 2021-06-06 DIAGNOSIS — R079 Chest pain, unspecified: Secondary | ICD-10-CM

## 2021-06-06 DIAGNOSIS — R0789 Other chest pain: Secondary | ICD-10-CM | POA: Diagnosis present

## 2021-06-06 DIAGNOSIS — M546 Pain in thoracic spine: Secondary | ICD-10-CM | POA: Insufficient documentation

## 2021-06-06 LAB — CBC WITH DIFFERENTIAL/PLATELET
Abs Immature Granulocytes: 0.02 10*3/uL (ref 0.00–0.07)
Basophils Absolute: 0.1 10*3/uL (ref 0.0–0.1)
Basophils Relative: 1 %
Eosinophils Absolute: 0.4 10*3/uL (ref 0.0–0.5)
Eosinophils Relative: 4 %
HCT: 42.9 % (ref 36.0–46.0)
Hemoglobin: 14.2 g/dL (ref 12.0–15.0)
Immature Granulocytes: 0 %
Lymphocytes Relative: 39 %
Lymphs Abs: 3.9 10*3/uL (ref 0.7–4.0)
MCH: 29.2 pg (ref 26.0–34.0)
MCHC: 33.1 g/dL (ref 30.0–36.0)
MCV: 88.3 fL (ref 80.0–100.0)
Monocytes Absolute: 0.6 10*3/uL (ref 0.1–1.0)
Monocytes Relative: 6 %
Neutro Abs: 5.1 10*3/uL (ref 1.7–7.7)
Neutrophils Relative %: 50 %
Platelets: 276 10*3/uL (ref 150–400)
RBC: 4.86 MIL/uL (ref 3.87–5.11)
RDW: 13.6 % (ref 11.5–15.5)
WBC: 10 10*3/uL (ref 4.0–10.5)
nRBC: 0 % (ref 0.0–0.2)

## 2021-06-06 LAB — BASIC METABOLIC PANEL
Anion gap: 9 (ref 5–15)
BUN: 14 mg/dL (ref 6–20)
CO2: 24 mmol/L (ref 22–32)
Calcium: 9.1 mg/dL (ref 8.9–10.3)
Chloride: 108 mmol/L (ref 98–111)
Creatinine, Ser: 0.61 mg/dL (ref 0.44–1.00)
GFR, Estimated: >60 mL/min (ref >60)
Glucose, Bld: 95 mg/dL (ref 70–99)
Potassium: 3.3 mmol/L — ABNORMAL LOW (ref 3.5–5.1)
Sodium: 141 mmol/L (ref 135–145)

## 2021-06-06 LAB — TROPONIN I (HIGH SENSITIVITY)
Troponin I (High Sensitivity): 3 ng/L (ref <18)
Troponin I (High Sensitivity): 3 ng/L (ref <18)

## 2021-06-06 MED ORDER — IOHEXOL 350 MG/ML SOLN
100.0000 mL | Freq: Once | INTRAVENOUS | Status: AC | PRN
  Administered 2021-06-06: 100 mL via INTRAVENOUS

## 2021-06-06 NOTE — ED Triage Notes (Signed)
Pt reports chest tightness that started last night and c/o upper  back pain since Monday   PMX -  anxiety

## 2021-06-06 NOTE — ED Provider Notes (Signed)
[facility] EMERGENCY DEPT Provider Note   CSN: [id number] Arrival date & time: 06/06/21  0325     History Chief Complaint  Patient presents with   Chest Pain    Leah [name] is a 45 y.o. female.  Patient presents to the emergency department for evaluation of chest pain.  Patient reports that symptoms began 2 days ago.  She has had pain ever since, and has never gone away.  She first felt in the area of the right upper chest and neck area.  It then developed into an upper back pain which has been persistent.  During the day today she noticed some worsening when she was walking.  Tonight she felt slightly short of breath and felt like she was sweating, which is what brought her to the ER.   HPI: A 45 year old patient with a history of obesity presents for evaluation of chest pain. Initial onset of pain was more than 6 hours ago. The patient's chest pain is described as heaviness/pressure/tightness and is worse with exertion. The patient reports some diaphoresis. The patient's chest pain is not middle- or left-sided, is not well-localized, is not sharp and does radiate to the arms/jaw/neck. The patient does not complain of nausea. The patient has smoked in the past 90 days. The patient has no history of stroke, has no history of peripheral artery disease, denies any history of treated diabetes, has no relevant family history of coronary artery disease (first degree relative at less than age 8), is not hypertensive and has no history of hypercholesterolemia.   Past Medical History:  Diagnosis Date   Anxiety     Patient Active Problem List   Diagnosis Date Noted   Right ear pain 05/20/2018   Atypical face pain 05/20/2018    History reviewed. No pertinent surgical history.   OB History     Gravida  5   Para  3   Term  0   Preterm      AB  2   Living         SAB      IAB      Ectopic      Multiple      Live Births              Family  History  Problem Relation Age of Onset   Cancer Mother    Cancer Other     Social History   Tobacco Use   Smoking status: Every Day    Packs/day: 1.00    Pack years: 0.00    Types: Cigarettes   Smokeless tobacco: Never  Vaping Use   Vaping Use: Never used  Substance Use Topics   Alcohol use: Yes    Comment: social   Drug use: No    Home Medications Prior to Admission medications   Medication Sig Start Date End Date Taking? Authorizing Provider  acetaminophen (TYLENOL) 500 MG tablet Take 500 mg by mouth every 6 (six) hours as needed.    [provider]  cetirizine (ZYRTEC) 10 MG tablet Take 10 mg by mouth daily.    [provider]  gabapentin (NEURONTIN) 100 MG capsule Take 1 capsule (100 mg total) by mouth 3 (three) times daily. 05/18/18   Muthersbaugh, Dahlia Client, PA-C  gabapentin (NEURONTIN) 300 MG capsule TAKE ONE CAPSULE BY MOUTH 3 TIMES A DAY 06/13/19   Sater, Pearletha Furl, MD  ibuprofen (ADVIL,MOTRIN) 200 MG tablet Take 200 mg by mouth every 6 (six) hours as  needed.    [provider]  lidocaine (LIDODERM) 5 % Place 1 patch onto the skin daily. Remove & Discard patch within 12 hours or as directed by MD 03/26/19   Antony Madura, PA-C  methylPREDNISolone (MEDROL DOSEPAK) 4 MG TBPK tablet Take over 6 days, start with 6 pills po the first day and taper off over 6 days 05/20/18   Sater, Pearletha Furl, MD  Oxcarbazepine (TRILEPTAL) 300 MG tablet Take one tablet in the morning and 2 tablets at bedtime 05/25/18   Sater, Pearletha Furl, MD    Allergies    Patient has no known allergies.  Review of Systems   Review of Systems  Physical Exam Updated Vital Signs BP 111/86   Pulse 78   Temp 98.3 F (36.8 C) (Oral)   Resp (!) 23   Ht 5\' 6"  (1.676 m)   Wt 86.2 kg   LMP 05/29/2021   SpO2 100%   BMI 30.67 kg/m   Physical Exam  ED Results / Procedures / Treatments   Labs (all labs ordered are listed, but only abnormal results are displayed) Labs Reviewed  BASIC  METABOLIC PANEL - Abnormal; Notable for the following components:      Result Value   Potassium 3.3 (*)    All other components within normal limits  CBC WITH DIFFERENTIAL/PLATELET  TROPONIN I (HIGH SENSITIVITY)  TROPONIN I (HIGH SENSITIVITY)    EKG EKG Interpretation  Date/Time:  Thursday June 06 2021 03:35:09 EDT Ventricular Rate:  78 PR Interval:  149 QRS Duration: 87 QT Interval:  405 QTC Calculation: 462 R Axis:   58 Text Interpretation: Sinus rhythm Borderline T abnormalities, anterior leads No significant change since last tracing Confirmed by [id number] [phone]) on 06/06/2021 3:47:38 AM  Radiology DG Chest Port 1 View  Result Date: 06/06/2021 CLINICAL DATA:  Chest pain, tightness, upper back pain since Monday EXAM: PORTABLE CHEST 1 VIEW COMPARISON:  Radiograph 03/26/2027, CT 1912 FINDINGS: Increased attenuation towards the lung bases is likely accentuation by the overlying breast tissue. No consolidation, features of edema, pneumothorax, or effusion. The cardiomediastinal contours are unremarkable. Telemetry leads overlie the chest. No acute or worrisome osseous or chest wall abnormality IMPRESSION: Accounting for body habitus, the lungs are clear. Electronically Signed   By: 1913 M.D.   On: 06/06/2021 04:43   CT ANGIO CHEST AORTA W/CM & OR WO/CM  Result Date: 06/06/2021 CLINICAL DATA:  45 year old female with chest tightness, chest and back pain for a day or more. EXAM: CT ANGIOGRAPHY CHEST WITH CONTRAST TECHNIQUE: Multidetector CT imaging of the chest was performed using the standard protocol prior to and during bolus administration of intravenous contrast. Multiplanar CT image reconstructions and MIPs were obtained to evaluate the vascular anatomy. CONTRAST:  54 OMNIPAQUE IOHEXOL 350 MG/ML SOLN COMPARISON:  CTA chest 08/10/2011. Portable chest radiograph 0416 hours today. FINDINGS: Cardiovascular: No calcified atherosclerosis is evident in the chest. Stable  and normal thoracic aorta. No aortic dissection or aneurysm. Proximal great vessels appear patent and normal. Good contrast bolus timing in the pulmonary arterial tree. No focal filling defect identified in the pulmonary arteries to suggest acute pulmonary embolism. 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 This is stable since 2012. Small volume pericardial fluid in a superior pericardial recess not significantly different from 2012. No cardiomegaly or pericardial effusion. Mediastinum/Nodes: Negative.  No lymphadenopathy. Lungs/Pleura: Lower lung volumes  compared to 2012. Major airways are patent. Mild dependent atelectasis in both lungs. Chronic mild apical lung scarring is stable. No pleural effusion. Upper Abdomen: Negative visible liver, spleen, pancreas, adrenal glands, kidneys and stomach in the upper abdomen. Musculoskeletal: Negative. Review of the MIP images confirms the above findings. IMPRESSION: 1. Thoracic aorta appears stable since 2012 and normal. 2. No acute pulmonary embolus identified. 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 3. Mild pulmonary atelectasis.  No other acute finding in the chest. Electronically Signed   By: Odessa Fleming M.D.   On: 06/06/2021 05:06    Procedures Procedures   Medications Ordered in ED Medications  iohexol (OMNIPAQUE) 350 MG/ML injection 100 mL (100 mLs Intravenous Contrast Given 06/06/21 0446)    ED Course  I have reviewed the triage vital signs and the nursing notes.  Pertinent labs & imaging results that were available during my care of the patient were reviewed by me and considered in my medical decision making (see chart for details).    MDM Rules/Calculators/A&P HEAR Score: 4                       Patient presents to the emergency department for evaluation of chest pain.   Patient has been experiencing upper chest and upper back pain for 2 days.  Pain has been continuous without relief.  Has minimal cardiac risk factors.  She is a smoker and she has borderline BMI, otherwise negative.  EKG is unchanged from prior.  Troponin negative x2.  Vital signs otherwise unremarkable.  With anterior and posterior thoracic symptoms, aortic dissection was considered.  CT angiography negative.  Patient felt to be low risk for cardiac etiology, does not require hospitalization.  Can be discharged and follow-up outpatient.  Final Clinical Impression(s) / ED Diagnoses Final diagnoses:  Chest pain, unspecified type    Rx / DC Orders ED Discharge Orders     None        Gilda Crease, MD 06/06/21 [phone]
